# Patient Record
Sex: Female | Born: 1985 | Race: Black or African American | Hispanic: No | Marital: Single | Smoking: Current some day smoker
Health system: Southern US, Community
[De-identification: ages and names within clinical notes are randomized; demographics above are authoritative.]

## PROBLEM LIST (undated history)

## (undated) ENCOUNTER — Emergency Department: Payer: Managed Care, Other (non HMO)

## (undated) DIAGNOSIS — I10 Essential (primary) hypertension: Secondary | ICD-10-CM

## (undated) DIAGNOSIS — E049 Nontoxic goiter, unspecified: Secondary | ICD-10-CM

## (undated) DIAGNOSIS — F419 Anxiety disorder, unspecified: Secondary | ICD-10-CM

## (undated) HISTORY — DX: Nontoxic goiter, unspecified: E04.9

## (undated) HISTORY — PX: NO PAST SURGERIES: SHX2092

## (undated) HISTORY — DX: Anxiety disorder, unspecified: F41.9

---

## 2015-01-06 ENCOUNTER — Ambulatory Visit
Admission: EM | Admit: 2015-01-06 | Discharge: 2015-01-06 | Disposition: A | Discharge status: Home / Self Care | Payer: 59 | Attending: Family Medicine | Admitting: Family Medicine

## 2015-01-06 ENCOUNTER — Other Ambulatory Visit: Payer: Self-pay

## 2015-01-06 DIAGNOSIS — R9431 Abnormal electrocardiogram [ECG] [EKG]: Secondary | ICD-10-CM

## 2015-01-06 DIAGNOSIS — R002 Palpitations: Secondary | ICD-10-CM | POA: Insufficient documentation

## 2015-01-06 DIAGNOSIS — R079 Chest pain, unspecified: Secondary | ICD-10-CM | POA: Diagnosis not present

## 2015-01-06 DIAGNOSIS — R42 Dizziness and giddiness: Secondary | ICD-10-CM | POA: Diagnosis present

## 2015-01-06 DIAGNOSIS — F17210 Nicotine dependence, cigarettes, uncomplicated: Secondary | ICD-10-CM | POA: Insufficient documentation

## 2015-01-06 DIAGNOSIS — R0789 Other chest pain: Secondary | ICD-10-CM | POA: Diagnosis not present

## 2015-01-06 DIAGNOSIS — Z720 Tobacco use: Secondary | ICD-10-CM

## 2015-01-06 DIAGNOSIS — R55 Syncope and collapse: Secondary | ICD-10-CM | POA: Insufficient documentation

## 2015-01-06 NOTE — ED Provider Notes (Signed)
CSN: [id number]     Arrival date & time 01/06/15  1420 History   First MD Initiated Contact with Patient 01/06/15 1509     Chief Complaint  Patient presents with  . Dizziness    Yesterday had an episode where she felt weak and reported to pass out with staff at where she worked called a code blue.  She was fine and did not follow up with medical care.  Last night she felt numb all over and just feels uncomfortable and feels like a flutter in her chest and discomfort.  She said her PMD has been following her blood pressure and it was rthinking about putting her on blood pressure medications.  She said that when they took her blood pressure it was she thinks 130/60   (Consider location/radiation/quality/duration/timing/severity/associated sxs/prior Treatment) Patient is a 29 y.o. female presenting with dizziness. The history is provided by the patient. No language interpreter was used.  Dizziness Quality:  Head spinning Severity:  Moderate Duration:  2 days Timing:  Intermittent Progression:  Waxing and waning Chronicity:  Recurrent Context: not with loss of consciousness   Relieved by:  Nothing Worsened by:  Movement Ineffective treatments:  None tried Associated symptoms: chest pain and shortness of breath   Chest pain:    Quality: pressure and shooting     Quality: not radiating     Severity:  Mild   Onset quality:  Sudden   Duration:  6 hours   Timing:  Intermittent   Chronicity:  New Risk factors: no anemia, no heart disease, no hx of stroke, no hx of vertigo, no Meniere's disease and no multiple medications   History reviewed. No pertinent past medical history. History reviewed. No pertinent past surgical history. History reviewed. No pertinent family history. History  Substance Use Topics  . Smoking status: Current Every Day Smoker    Types: Cigars  . Smokeless tobacco: Not on file  . Alcohol Use: Yes   OB History    No data available     Review of Systems   Respiratory: Positive for shortness of breath.   Cardiovascular: Positive for chest pain.  Neurological: Positive for dizziness.  All other systems reviewed and are negative.   Allergies  Review of patient's allergies indicates no known allergies.  Home Medications   Prior to Admission medications   Not on File   BP 144/74 mmHg  Pulse 79  Temp(Src) 98.6 F (37 C) (Oral)  Resp 16  SpO2 100% Physical Exam  Constitutional: She is oriented to person, place, and time. She appears well-developed and well-nourished.  HENT:  Head: Normocephalic and atraumatic.  Eyes: Pupils are equal, round, and reactive to light.  Neck: Neck supple.  Cardiovascular: Regular rhythm and normal heart sounds.   Pulmonary/Chest: Effort normal.  Abdominal: Bowel sounds are normal.  Musculoskeletal: Normal range of motion.  Neurological: She is alert and oriented to person, place, and time.  Skin: Skin is warm.  Vitals reviewed.   ED Course  Procedures (including critical care time) Labs Review Labs Reviewed - No data to display  Imaging Review No results found. Should be noted the patient has been having history of elevation of her blood pressure. She still smokes and was placed on a dash diet. Despite that she's been eating noodles and loaded with salt but just diluting the sauce with more water. I've informed her that that doesn't help lower the sodium intake that she's having. Yesterday she had a near-syncopal episode have to  have her workplace [employer] team responded to her. She went home to get medical care and today woke up with palpitations and that has some chest pain. She still feels lightheaded and dizzy and felt that she needed to be evaluated and treated. She was informed with her symptoms emergency room with a better place for her. She probably will need serial cardiac enzymes, Holter monitor lab work and possible CT of the head. Offered EMS patient wants to go by private car. Warned that  she needs to stop smoking regardless as well.  EKG was also abnormal showing inverted T waves to 3 and aVF and some nonspecific ST elevation in V2 with T-wave inversion in 2 through V6 indicating a possible inferior ischemia or anterolateral ischemia. She denies ever having an EKG done before so we have no way of knowing how acute changes are but once again emphasize the need to go to an ED for more thorough evaluation.  No results found for this or any previous visit.  MDM   1. Other chest pain   2. Palpitations   3. Abnormal finding on EKG   4. Near syncope   5. Dizziness   6. Tobacco abuse        Frederich Cha, MD 01/06/15 [phone]

## 2015-01-06 NOTE — Discharge Instructions (Signed)
Chest Pain (Nonspecific) °It is often hard to give a specific diagnosis for the cause of chest pain. There is always a chance that your pain could be related to something serious, such as a heart attack or a blood clot in the lungs. You need to follow up with your health care provider for further evaluation. °CAUSES  °· Heartburn. °· Pneumonia or bronchitis. °· Anxiety or stress. °· Inflammation around your heart (pericarditis) or lung (pleuritis or pleurisy). °· A blood clot in the lung. °· A collapsed lung (pneumothorax). It can develop suddenly on its own (spontaneous pneumothorax) or from trauma to the chest. °· Shingles infection (herpes zoster virus). °The chest wall is composed of bones, muscles, and cartilage. Any of these can be the source of the pain. °· The bones can be bruised by injury. °· The muscles or cartilage can be strained by coughing or overwork. °· The cartilage can be affected by inflammation and become sore (costochondritis). °DIAGNOSIS  °Lab tests or other studies may be needed to find the cause of your pain. Your health care provider may have you take a test called an ambulatory electrocardiogram (ECG). An ECG records your heartbeat patterns over a 24-hour period. You may also have other tests, such as: °· Transthoracic echocardiogram (TTE). During echocardiography, sound waves are used to evaluate how blood flows through your heart. °· Transesophageal echocardiogram (TEE). °· Cardiac monitoring. This allows your health care provider to monitor your heart rate and rhythm in real time. °· Holter monitor. This is a portable device that records your heartbeat and can help diagnose heart arrhythmias. It allows your health care provider to track your heart activity for several days, if needed. °· Stress tests by exercise or by giving medicine that makes the heart beat faster. °TREATMENT  °· Treatment depends on what may be causing your chest pain. Treatment may include: °· Acid blockers for  heartburn. °· Anti-inflammatory medicine. °· Pain medicine for inflammatory conditions. °· Antibiotics if an infection is present. °· You may be advised to change lifestyle habits. This includes stopping smoking and avoiding alcohol, caffeine, and chocolate. °· You may be advised to keep your head raised (elevated) when sleeping. This reduces the chance of acid going backward from your stomach into your esophagus. °Most of the time, nonspecific chest pain will improve within 2-3 days with rest and mild pain medicine.  °HOME CARE INSTRUCTIONS  °· If antibiotics were prescribed, take them as directed. Finish them even if you start to feel better. °· For the next few days, avoid physical activities that bring on chest pain. Continue physical activities as directed. °· Do not use any tobacco products, including cigarettes, chewing tobacco, or electronic cigarettes. °· Avoid drinking alcohol. °· Only take medicine as directed by your health care provider. °· Follow your health care provider's suggestions for further testing if your chest pain does not go away. °· Keep any follow-up appointments you made. If you do not go to an appointment, you could develop lasting (chronic) problems with pain. If there is any problem keeping an appointment, call to reschedule. °SEEK MEDICAL CARE IF:  °· Your chest pain does not go away, even after treatment. °· You have a rash with blisters on your chest. °· You have a fever. °SEEK IMMEDIATE MEDICAL CARE IF:  °· You have increased chest pain or pain that spreads to your arm, neck, jaw, back, or abdomen. °· You have shortness of breath. °· You have an increasing cough, or you cough   up blood.  You have severe back or abdominal pain.  You feel nauseous or vomit.  You have severe weakness.  You faint.  You have chills. This is an emergency. Do not wait to see if the pain will go away. Get medical help at once. Call your local emergency services (911 in [place] Do not drive  yourself to the hospital. MAKE SURE YOU:   Understand these instructions.  Will watch your condition.  Will get help right away if you are not doing well or get worse. Document Released: 05/01/2005 Document Revised: 07/27/2013 Document Reviewed: 02/25/2008 [facility] Patient Information 2015 [place], [place]. This information is not intended to replace advice given to you by your health care provider. Make sure you discuss any questions you have with your health care provider.  Near-Syncope Near-syncope (commonly known as near fainting) is sudden weakness, dizziness, or feeling like you might pass out. This can happen when getting up or while standing for a long time. It is caused by a sudden decrease in blood flow to the brain, which can occur for various reasons. Most of the reasons are not serious.  HOME CARE Watch your condition for any changes.  Have someone stay with you until you feel stable.  If you feel like you are going to pass out:  Lie down right away.  Prop your feet up if you can.  Breathe deeply and steadily.  Move only when the feeling has gone away. Most of the time, this feeling lasts only a few minutes. You may feel tired for several hours.  Drink enough fluids to keep your pee (urine) clear or pale yellow.  If you are taking blood pressure or heart medicine, stand up slowly.  Follow up with your doctor as told. GET HELP RIGHT AWAY IF:   You have a severe headache.  You have unusual pain in the chest, belly (abdomen), or back.  You have bleeding from the mouth or butt (rectum), or you have black or tarry poop (stool).  You feel your heart beat differently than normal, or you have a very fast pulse.  You pass out, or you twitch and shake when you pass out.  You pass out when sitting or lying down.  You feel confused.  You have trouble walking.  You are weak.  You have vision problems. MAKE SURE YOU:   Understand these instructions.  Will watch  your condition.  Will get help right away if you are not doing well or get worse. Document Released: 01/08/2008 Document Revised: 07/27/2013 Document Reviewed: 12/25/2012 [facility] Patient Information 2015 [place], [place]. This information is not intended to replace advice given to you by your health care provider. Make sure you discuss any questions you have with your health care provider.  Palpitations A palpitation is the feeling that your heartbeat is irregular or is faster than normal. It may feel like your heart is fluttering or skipping a beat. Palpitations are usually not a serious problem. However, in some cases, you may need further medical evaluation. CAUSES  Palpitations can be caused by:  Smoking.  Caffeine or other stimulants, such as diet pills or energy drinks.  Alcohol.  Stress and anxiety.  Strenuous physical activity.  Fatigue.  Certain medicines.  Heart disease, especially if you have a history of irregular heart rhythms (arrhythmias), such as atrial fibrillation, atrial flutter, or supraventricular tachycardia.  An improperly working pacemaker or defibrillator. DIAGNOSIS  To find the cause of your palpitations, your health care provider will  take your medical history and perform a physical exam. Your health care provider may also have you take a test called an ambulatory electrocardiogram (ECG). An ECG records your heartbeat patterns over a 24-hour period. You may also have other tests, such as:  Transthoracic echocardiogram (TTE). During echocardiography, sound waves are used to evaluate how blood flows through your heart.  Transesophageal echocardiogram (TEE).  Cardiac monitoring. This allows your health care provider to monitor your heart rate and rhythm in real time.  Holter monitor. This is a portable device that records your heartbeat and can help diagnose heart arrhythmias. It allows your health care provider to track your heart activity for several  days, if needed.  Stress tests by exercise or by giving medicine that makes the heart beat faster. TREATMENT  Treatment of palpitations depends on the cause of your symptoms and can vary greatly. Most cases of palpitations do not require any treatment other than time, relaxation, and monitoring your symptoms. Other causes, such as atrial fibrillation, atrial flutter, or supraventricular tachycardia, usually require further treatment. HOME CARE INSTRUCTIONS   Avoid:  Caffeinated coffee, tea, soft drinks, diet pills, and energy drinks.  Chocolate.  Alcohol.  Stop smoking if you smoke.  Reduce your stress and anxiety. Things that can help you relax include:  A method of controlling things in your body, such as your heartbeats, with your mind (biofeedback).  Yoga.  Meditation.  Physical activity such as swimming, jogging, or walking.  Get plenty of rest and sleep. SEEK MEDICAL CARE IF:   You continue to have a fast or irregular heartbeat beyond 24 hours.  Your palpitations occur more often. SEEK IMMEDIATE MEDICAL CARE IF:  You have chest pain or shortness of breath.  You have a severe headache.  You feel dizzy or you faint. MAKE SURE YOU:  Understand these instructions.  Will watch your condition.  Will get help right away if you are not doing well or get worse. Document Released: 07/19/2000 Document Revised: 07/27/2013 Document Reviewed: 09/20/2011 [facility] Patient Information 2015 [place], [place]. This information is not intended to replace advice given to you by your health care provider. Make sure you discuss any questions you have with your health care provider.

## 2015-01-06 NOTE — ED Notes (Signed)
Yesterday had an episode where she felt weak and reported to pass out with staff at where she worked called a code blue.  She was fine and did not follow up with medical care.  Last night she felt numb all over and just feels uncomfortable and feels like a flutter in her chest and discomfort.  She said her PMD has been following her blood pressure and it was rthinking about putting her on blood pressure medications.  She said that when they took her blood pressure it was she thinks 130/60

## 2017-03-18 ENCOUNTER — Emergency Department
Admission: EM | Admit: 2017-03-18 | Discharge: 2017-03-18 | Disposition: A | Discharge status: Home / Self Care | Payer: 59 | Attending: Emergency Medicine | Admitting: Emergency Medicine

## 2017-03-18 ENCOUNTER — Emergency Department: Payer: 59

## 2017-03-18 ENCOUNTER — Encounter: Payer: Self-pay | Admitting: Emergency Medicine

## 2017-03-18 DIAGNOSIS — R1030 Lower abdominal pain, unspecified: Secondary | ICD-10-CM | POA: Diagnosis present

## 2017-03-18 DIAGNOSIS — Z3A01 Less than 8 weeks gestation of pregnancy: Secondary | ICD-10-CM | POA: Insufficient documentation

## 2017-03-18 DIAGNOSIS — O209 Hemorrhage in early pregnancy, unspecified: Secondary | ICD-10-CM | POA: Insufficient documentation

## 2017-03-18 DIAGNOSIS — R102 Pelvic and perineal pain: Secondary | ICD-10-CM

## 2017-03-18 DIAGNOSIS — Z87891 Personal history of nicotine dependence: Secondary | ICD-10-CM | POA: Diagnosis not present

## 2017-03-18 DIAGNOSIS — O200 Threatened abortion: Secondary | ICD-10-CM

## 2017-03-18 LAB — URINALYSIS, COMPLETE (UACMP) WITH MICROSCOPIC
BILIRUBIN URINE: NEGATIVE
Glucose, UA: NEGATIVE mg/dL
Hgb urine dipstick: NEGATIVE
Ketones, ur: NEGATIVE mg/dL
LEUKOCYTES UA: NEGATIVE
NITRITE: NEGATIVE
PH: 6 (ref 5.0–8.0)
Protein, ur: NEGATIVE mg/dL
SPECIFIC GRAVITY, URINE: 1.013 (ref 1.005–1.030)

## 2017-03-18 LAB — CBC
HEMATOCRIT: 36 % (ref 35.0–47.0)
HEMOGLOBIN: 12.1 g/dL (ref 12.0–16.0)
MCH: 28.2 pg (ref 26.0–34.0)
MCHC: 33.7 g/dL (ref 32.0–36.0)
MCV: 83.6 fL (ref 80.0–100.0)
Platelets: 298 10*3/uL (ref 150–440)
RBC: 4.3 MIL/uL (ref 3.80–5.20)
RDW: 13.6 % (ref 11.5–14.5)
WBC: 8.1 10*3/uL (ref 3.6–11.0)

## 2017-03-18 LAB — ABO/RH: ABO/RH(D): B POS

## 2017-03-18 LAB — COMPREHENSIVE METABOLIC PANEL
ALBUMIN: 4.4 g/dL (ref 3.5–5.0)
ALK PHOS: 57 U/L (ref 38–126)
ALT: 20 U/L (ref 14–54)
ANION GAP: 9 (ref 5–15)
AST: 22 U/L (ref 15–41)
BILIRUBIN TOTAL: 0.4 mg/dL (ref 0.3–1.2)
BUN: 11 mg/dL (ref 6–20)
CALCIUM: 9.5 mg/dL (ref 8.9–10.3)
CO2: 22 mmol/L (ref 22–32)
CREATININE: 0.84 mg/dL (ref 0.44–1.00)
Chloride: 104 mmol/L (ref 101–111)
GFR calc Af Amer: >60 mL/min (ref >60)
GFR calc non Af Amer: >60 mL/min (ref >60)
GLUCOSE: 119 mg/dL — AB (ref 65–99)
Potassium: 3.9 mmol/L (ref 3.5–5.1)
SODIUM: 135 mmol/L (ref 135–145)
TOTAL PROTEIN: 8.3 g/dL — AB (ref 6.5–8.1)

## 2017-03-18 LAB — POCT PREGNANCY, URINE: Preg Test, Ur: POSITIVE — AB

## 2017-03-18 LAB — HCG, QUANTITATIVE, PREGNANCY: hCG, Beta Chain, Quant, S: 9048 m[IU]/mL — ABNORMAL HIGH (ref <5)

## 2017-03-18 NOTE — Discharge Instructions (Signed)
Results for orders placed or performed during the hospital encounter of 03/18/17  Urinalysis, Complete w Microscopic  Result Value Ref Range   Color, Urine YELLOW (A) YELLOW   APPearance CLEAR (A) CLEAR   Specific Gravity, Urine 1.013 1.005 - 1.030   pH 6.0 5.0 - 8.0   Glucose, UA NEGATIVE NEGATIVE mg/dL   Hgb urine dipstick NEGATIVE NEGATIVE   Bilirubin Urine NEGATIVE NEGATIVE   Ketones, ur NEGATIVE NEGATIVE mg/dL   Protein, ur NEGATIVE NEGATIVE mg/dL   Nitrite NEGATIVE NEGATIVE   Leukocytes, UA NEGATIVE NEGATIVE   RBC / HPF 0-5 0 - 5 RBC/hpf   WBC, UA 0-5 0 - 5 WBC/hpf   Bacteria, UA RARE (A) NONE SEEN   Squamous Epithelial / LPF 0-5 (A) NONE SEEN   Mucous PRESENT   hCG, quantitative, pregnancy  Result Value Ref Range   hCG, Beta Chain, Quant, S 9,048 (H) <5 mIU/mL  CBC  Result Value Ref Range   WBC 8.1 3.6 - 11.0 K/uL   RBC 4.30 3.80 - 5.20 MIL/uL   Hemoglobin 12.1 12.0 - 16.0 g/dL   HCT 36.0 35.0 - 47.0 %   MCV 83.6 80.0 - 100.0 fL   MCH 28.2 26.0 - 34.0 pg   MCHC 33.7 32.0 - 36.0 g/dL   RDW 13.6 11.5 - 14.5 %   Platelets 298 150 - 440 K/uL  Comprehensive metabolic panel  Result Value Ref Range   Sodium 135 135 - 145 mmol/L   Potassium 3.9 3.5 - 5.1 mmol/L   Chloride 104 101 - 111 mmol/L   CO2 22 22 - 32 mmol/L   Glucose, Bld 119 (H) 65 - 99 mg/dL   BUN 11 6 - 20 mg/dL   Creatinine, Ser 0.84 0.44 - 1.00 mg/dL   Calcium 9.5 8.9 - 10.3 mg/dL   Total Protein 8.3 (H) 6.5 - 8.1 g/dL   Albumin 4.4 3.5 - 5.0 g/dL   AST 22 15 - 41 U/L   ALT 20 14 - 54 U/L   Alkaline Phosphatase 57 38 - 126 U/L   Total Bilirubin 0.4 0.3 - 1.2 mg/dL   GFR calc non Af Amer >60 >60 mL/min   GFR calc Af Amer >60 >60 mL/min   Anion gap 9 5 - 15  Pregnancy, urine POC  Result Value Ref Range   Preg Test, Ur POSITIVE (A) NEGATIVE  ABO/Rh  Result Value Ref Range   ABO/RH(D) B POS    US Ob Comp Less 14 Wks  Result Date: 03/18/2017 CLINICAL DATA:  Pregnant patient in first-trimester  pregnancy with lower abdominal pain and spotting. EXAM: OBSTETRIC <14 WK [place] AND TRANSVAGINAL OB US TECHNIQUE: Both transabdominal and transvaginal ultrasound examinations were performed for complete evaluation of the gestation as well as the maternal uterus, adnexal regions, and pelvic cul-de-sac. Transvaginal technique was performed to assess early pregnancy. COMPARISON:  None. FINDINGS: Intrauterine gestational sac: Single Yolk sac:  Visualized. Embryo:  Not Visualized. Cardiac Activity: Not Visualized. MSD: 9  mm   5 w   5  d Subchorionic hemorrhage:  None visualized. Maternal uterus/adnexae: The right ovary is visualized and normal. There is a rounded echogenic structure in the left adnexa measuring 6.5 x 6.8 x 7.1 cm without definite vascularity. Left ovary is not discretely visualized. Trace pelvic free fluid. IMPRESSION: 1. Probable early intrauterine gestational sac with a yolk sac, but no fetal pole or cardiac activity yet visualized. Recommend follow-up quantitative B-HCG levels and follow-up US in 10-14 days  to assess viability. This recommendation follows SRU consensus guidelines: Diagnostic Criteria for Nonviable Pregnancy Early in the First Trimester. [employer] Med 2013; 737:1062-69. 2. Round heterogeneous 7 cm echogenic structure in the left adnexa, with nonvisualization of left ovary. Differential considerations include left ovarian mass versus non peristalsing bowel. This can be re-assessed on follow-up ultrasound. Electronically Signed   By: Jeb Levering M.D.   On: 03/18/2017 21:40   US Ob Transvaginal  Result Date: 03/18/2017 CLINICAL DATA:  Pregnant patient in first-trimester pregnancy with lower abdominal pain and spotting. EXAM: OBSTETRIC <14 WK [place] AND TRANSVAGINAL OB US TECHNIQUE: Both transabdominal and transvaginal ultrasound examinations were performed for complete evaluation of the gestation as well as the maternal uterus, adnexal regions, and pelvic cul-de-sac. Transvaginal  technique was performed to assess early pregnancy. COMPARISON:  None. FINDINGS: Intrauterine gestational sac: Single Yolk sac:  Visualized. Embryo:  Not Visualized. Cardiac Activity: Not Visualized. MSD: 9  mm   5 w   5  d Subchorionic hemorrhage:  None visualized. Maternal uterus/adnexae: The right ovary is visualized and normal. There is a rounded echogenic structure in the left adnexa measuring 6.5 x 6.8 x 7.1 cm without definite vascularity. Left ovary is not discretely visualized. Trace pelvic free fluid. IMPRESSION: 1. Probable early intrauterine gestational sac with a yolk sac, but no fetal pole or cardiac activity yet visualized. Recommend follow-up quantitative B-HCG levels and follow-up US in 10-14 days to assess viability. This recommendation follows SRU consensus guidelines: Diagnostic Criteria for Nonviable Pregnancy Early in the First Trimester. [employer] Med 2013; 485:4627-03. 2. Round heterogeneous 7 cm echogenic structure in the left adnexa, with nonvisualization of left ovary. Differential considerations include left ovarian mass versus non peristalsing bowel. This can be re-assessed on follow-up ultrasound. Electronically Signed   By: Jeb Levering M.D.   On: 03/18/2017 21:40

## 2017-03-18 NOTE — ED Notes (Signed)
Pt verbalizes understanding of d/c teaching, pt ambulatory , VS stable in NAD during time of d/c

## 2017-03-18 NOTE — ED Triage Notes (Signed)
Patient ambulatory to triage with steady gait, without difficulty or distress noted; pt reports lower abd pain, intermittent since last week; denies any other c/o; St confirmed pregnancy via [facility], approx 5-6 wks; G2P1; st spotting earlier but no bleeding at present

## 2017-03-18 NOTE — ED Provider Notes (Signed)
[facility] Emergency Department Provider Note  ____________________________________________  Time seen: Approximately 9:40 PM  I have reviewed the triage vital signs and the nursing notes.   HISTORY  Chief Complaint Abdominal Pain    HPI Joanne Shannon is a 31 y.o. female G2 P1, [redacted] weeks pregnant, appointment planned to establish care at [facility] on September 10, complains of vaginal spotting earlier today. She noticed spotting 2 times while using the bathroom, has since resolved. Also complains of cramping lower abdominal pain for the past week, nonradiating, no aggravating or alleviating factors. No vaginal discharge or fluid leakage. No fevers chills chest pain shortness of breath. Pain is mild to moderate in intensity. Intermittent.     History reviewed. No pertinent past medical history.   There are no active problems to display for this patient.    History reviewed. No pertinent surgical history.   Prior to Admission medications   Not on File  Folic acid supplement   Allergies Patient has no known allergies.   No family history on file.  Social History Social History  Substance Use Topics  . Smoking status: Former Smoker    Types: Cigars  . Smokeless tobacco: Never Used  . Alcohol use No    Review of Systems  Constitutional:   No fever or chills.  ENT:   No sore throat. No rhinorrhea. Cardiovascular:   No chest pain or syncope. Respiratory:   No dyspnea or cough. Gastrointestinal:   Positive for cramping abdominal pain as above without vomiting and diarrhea.  Musculoskeletal:   Negative for focal pain or swelling All other systems reviewed and are negative except as documented above in ROS and HPI.  ____________________________________________   PHYSICAL EXAM:  VITAL SIGNS: ED Triage Vitals  Enc Vitals Group     BP 03/18/17 1929 (!) 145/86     Pulse Rate 03/18/17 1929 81     Resp 03/18/17 1929 20   Temp 03/18/17 1929 98 F (36.7 C)     Temp Source 03/18/17 1929 Oral     SpO2 03/18/17 1929 100 %     Weight 03/18/17 1927 243 lb (110.2 kg)     Height 03/18/17 1927 5\' 8"  (1.727 m)     Head Circumference --      Peak Flow --      Pain Score 03/18/17 1927 2     Pain Loc --      Pain Edu? --      Excl. in [place]? --     Vital signs reviewed, nursing assessments reviewed.   Constitutional:   Alert and oriented. Well appearing and in no distress. Eyes:   No scleral icterus.  EOMI. No nystagmus. No conjunctival pallor. PERRL. ENT   Head:   Normocephalic and atraumatic.   Nose:   No congestion/rhinnorhea.    Mouth/Throat:   MMM, no pharyngeal erythema. No peritonsillar mass.    Neck:   No meningismus. Full ROM Hematological/Lymphatic/Immunilogical:   No cervical lymphadenopathy. Cardiovascular:   RRR. Symmetric bilateral radial and DP pulses.  No murmurs.  Respiratory:   Normal respiratory effort without tachypnea/retractions. Breath sounds are clear and equal bilaterally. No wheezes/rales/rhonchi. Gastrointestinal:   Soft and nontender. Non distended. There is no CVA tenderness.  No rebound, rigidity, or guarding. Genitourinary:   deferred Musculoskeletal:   Normal range of motion in all extremities. No joint effusions.  No lower extremity tenderness.  No edema. Neurologic:   Normal speech and language.  Motor grossly intact.  No gross focal neurologic deficits are appreciated.  Skin:    Skin is warm, dry and intact. No rash noted.  No petechiae, purpura, or bullae.  ____________________________________________    LABS (pertinent positives/negatives) (all labs ordered are listed, but only abnormal results are displayed) Labs Reviewed  URINALYSIS, COMPLETE (UACMP) WITH MICROSCOPIC - Abnormal; Notable for the following:       Result Value   Color, Urine YELLOW (*)    APPearance CLEAR (*)    Bacteria, UA RARE (*)    Squamous Epithelial / LPF 0-5 (*)    All other  components within normal limits  HCG, QUANTITATIVE, PREGNANCY - Abnormal; Notable for the following:    hCG, Beta Chain, Quant, S 9,048 (*)    All other components within normal limits  COMPREHENSIVE METABOLIC PANEL - Abnormal; Notable for the following:    Glucose, Bld 119 (*)    Total Protein 8.3 (*)    All other components within normal limits  POCT PREGNANCY, URINE - Abnormal; Notable for the following:    Preg Test, Ur POSITIVE (*)    All other components within normal limits  CBC  ABO/RH   ____________________________________________   EKG    ____________________________________________    RADIOLOGY  US Ob Comp Less 14 Wks  Result Date: 03/18/2017 CLINICAL DATA:  Pregnant patient in first-trimester pregnancy with lower abdominal pain and spotting. EXAM: OBSTETRIC <14 WK [place] AND TRANSVAGINAL OB US TECHNIQUE: Both transabdominal and transvaginal ultrasound examinations were performed for complete evaluation of the gestation as well as the maternal uterus, adnexal regions, and pelvic cul-de-sac. Transvaginal technique was performed to assess early pregnancy. COMPARISON:  None. FINDINGS: Intrauterine gestational sac: Single Yolk sac:  Visualized. Embryo:  Not Visualized. Cardiac Activity: Not Visualized. MSD: 9  mm   5 w   5  d Subchorionic hemorrhage:  None visualized. Maternal uterus/adnexae: The right ovary is visualized and normal. There is a rounded echogenic structure in the left adnexa measuring 6.5 x 6.8 x 7.1 cm without definite vascularity. Left ovary is not discretely visualized. Trace pelvic free fluid. IMPRESSION: 1. Probable early intrauterine gestational sac with a yolk sac, but no fetal pole or cardiac activity yet visualized. Recommend follow-up quantitative B-HCG levels and follow-up US in 10-14 days to assess viability. This recommendation follows SRU consensus guidelines: Diagnostic Criteria for Nonviable Pregnancy Early in the First Trimester. [employer] Med 2013;  034:7425-95. 2. Round heterogeneous 7 cm echogenic structure in the left adnexa, with nonvisualization of left ovary. Differential considerations include left ovarian mass versus non peristalsing bowel. This can be re-assessed on follow-up ultrasound. Electronically Signed   By: Jeb Levering M.D.   On: 03/18/2017 21:40   US Ob Transvaginal  Result Date: 03/18/2017 CLINICAL DATA:  Pregnant patient in first-trimester pregnancy with lower abdominal pain and spotting. EXAM: OBSTETRIC <14 WK [place] AND TRANSVAGINAL OB US TECHNIQUE: Both transabdominal and transvaginal ultrasound examinations were performed for complete evaluation of the gestation as well as the maternal uterus, adnexal regions, and pelvic cul-de-sac. Transvaginal technique was performed to assess early pregnancy. COMPARISON:  None. FINDINGS: Intrauterine gestational sac: Single Yolk sac:  Visualized. Embryo:  Not Visualized. Cardiac Activity: Not Visualized. MSD: 9  mm   5 w   5  d Subchorionic hemorrhage:  None visualized. Maternal uterus/adnexae: The right ovary is visualized and normal. There is a rounded echogenic structure in the left adnexa measuring 6.5 x 6.8 x 7.1 cm without definite vascularity. Left ovary is not discretely  visualized. Trace pelvic free fluid. IMPRESSION: 1. Probable early intrauterine gestational sac with a yolk sac, but no fetal pole or cardiac activity yet visualized. Recommend follow-up quantitative B-HCG levels and follow-up US in 10-14 days to assess viability. This recommendation follows SRU consensus guidelines: Diagnostic Criteria for Nonviable Pregnancy Early in the First Trimester. [employer] Med 2013; 419:6222-97. 2. Round heterogeneous 7 cm echogenic structure in the left adnexa, with nonvisualization of left ovary. Differential considerations include left ovarian mass versus non peristalsing bowel. This can be re-assessed on follow-up ultrasound. Electronically Signed   By: Jeb Levering M.D.   On: 03/18/2017  21:40    ____________________________________________   PROCEDURES Procedures  ____________________________________________   INITIAL IMPRESSION / ASSESSMENT AND PLAN / ED COURSE  Pertinent labs & imaging results that were available during my care of the patient were reviewed by me and considered in my medical decision making (see chart for details).  Patient well appearing no acute distress, unremarkable vital signs, presents with vaginal bleeding characterized by spotting in the setting of 5-6 week pregnancy. Rh+, exam benign. Ultrasound ordered to evaluate for ectopic. If IUP is demonstrated, patient is suitable for outpatient follow-up. Recommended follow-up in 2 days for serial hCG testing.      ----------------------------------------- 10:22 PM on 03/18/2017 -----------------------------------------  Ultrasound reveals an IUP with yolk sac but no fetal pole. 7 cm left adnexal mass, ovarian versus loop of bowel according to the radiology interpretation. Case discussed with Dr. Enzo Bi, noting that hCG is 9000 but no fetal pole or cardiac activity yet. They will plan to see the patient in clinic in 2 days for repeat hCG and ultrasound. Patient counseled on results and plan and is agreeable. Patient will call clinic in the morning to schedule appointment. She is calm and comfortable. Presentation is not consistent with TOA or torsion. Low likelihood of ectopic, suitable for outpatient follow-up. Low suspicion of appendicitis or bowel structure. ____________________________________________   FINAL CLINICAL IMPRESSION(S) / ED DIAGNOSES  Final diagnoses:  Pelvic pain in female  Vaginal bleeding in pregnancy, first trimester  Threatened miscarriage      New Prescriptions   No medications on file     Portions of this note were generated with dragon dictation software. Dictation errors may occur despite best attempts at proofreading.    Carrie Mew, MD 03/18/17  2225

## 2017-03-18 NOTE — ED Notes (Signed)
Patient presents to ED for complaints of midabominal pain and states she is [redacted] weeks pregnant. Spotting earlier today but has stopped at this time.

## 2017-03-21 ENCOUNTER — Ambulatory Visit (INDEPENDENT_AMBULATORY_CARE_PROVIDER_SITE_OTHER): Payer: 59 | Admitting: Obstetrics and Gynecology

## 2017-03-21 ENCOUNTER — Encounter: Payer: Self-pay | Admitting: Obstetrics and Gynecology

## 2017-03-21 VITALS — BP 123/78 | HR 90 | Ht 68.0 in | Wt 243.8 lb

## 2017-03-21 DIAGNOSIS — O200 Threatened abortion: Secondary | ICD-10-CM | POA: Diagnosis not present

## 2017-03-21 DIAGNOSIS — N9489 Other specified conditions associated with female genital organs and menstrual cycle: Secondary | ICD-10-CM

## 2017-03-21 DIAGNOSIS — N949 Unspecified condition associated with female genital organs and menstrual cycle: Secondary | ICD-10-CM

## 2017-03-21 NOTE — Patient Instructions (Addendum)
1. Quantitative hCG titer is obtained today 2. Ultrasound is to be scheduled for next Wednesday 3. Follow-up after ultrasound next Wednesday for further management planning 4. Return if heavy bleeding or pelvic pain developed

## 2017-03-21 NOTE — Progress Notes (Signed)
GYN ENCOUNTER NOTE  Subjective:       Joanne Shannon is a 31 y.o. G86P1001 female is here for gynecologic evaluation of the following issues:  1. Threatened abortion 2. Left adnexal mass.    Emergency room referral for threatened abortion. Quantitative hCG in 03/18/2017 9048. Ultrasound 03/18/2017:  IMPRESSION: 1. Probable early intrauterine gestational sac with a yolk sac, but no fetal pole or cardiac activity yet visualized. Recommend follow-up quantitative B-HCG levels and follow-up US in 10-14 days to assess viability. This recommendation follows SRU consensus guidelines: Diagnostic Criteria for Nonviable Pregnancy Early in the First Trimester. [employer] Med 2013; 676:7209-47. 2. Round heterogeneous 7 cm echogenic structure in the left adnexa, with nonvisualization of left ovary. Differential considerations include left ovarian mass versus non peristalsing bowel. This can be re-assessed on follow-up ultrasound.   Electronically Signed   By: Jeb Levering M.D.   On: 03/18/2017 21:40  Patient reports no further significant vaginal bleeding. She has minimal pelvic discomfort rated at a 2 out of 10. Patient reports having breast tenderness and mild nausea without vomiting. Bowel and bladder function are normal.   Gynecologic History Patient's last menstrual period was 02/06/2017 (exact date).  Obstetric History OB History  Gravida Para Term Preterm AB Living  2 1 1     1   SAB TAB Ectopic Multiple Live Births          1    # Outcome Date GA Lbr Len/2nd Weight Sex Delivery Anes PTL Lv  2 Current           1 Term 2008   7 lb 3.2 oz (3.266 kg) M Vag-Spont   LIV      Past Medical History:  Diagnosis Date  . Anxiety   . Enlarged thyroid     Past Surgical History:  Procedure Laterality Date  . NO PAST SURGERIES      No current outpatient prescriptions on file prior to visit.   No current facility-administered medications on file prior to visit.     No  Known Allergies  Social History   Social History  . Marital status: Single    Spouse name: N/A  . Number of children: N/A  . Years of education: N/A   Occupational History  . Not on file.   Social History Main Topics  . Smoking status: Former Smoker    Types: Cigars  . Smokeless tobacco: Never Used  . Alcohol use No  . Drug use: No  . Sexual activity: Yes    Birth control/ protection: None   Other Topics Concern  . Not on file   Social History Narrative  . No narrative on file    Family History  Problem Relation Age of Onset  . Diabetes Sister   . Breast cancer Neg Hx   . Ovarian cancer Neg Hx   . Colon cancer Neg Hx     The following portions of the patient's history were reviewed and updated as appropriate: allergies, current medications, past family history, past medical history, past social history, past surgical history and problem list.  Review of Systems See history of present illness  Objective:   BP 123/78   Pulse 90   Ht 5\' 8"  (1.727 m)   Wt 243 lb 12.8 oz (110.6 kg)   LMP 02/06/2017 (Exact Date)   BMI 37.07 kg/m  CONSTITUTIONAL: Well-developed, well-nourished female in no acute distress.  HENT:  Normocephalic, atraumatic.  NECK: Normal range of motion, supple, no  masses.  Normal thyroid.  SKIN: Skin is warm and dry. No rash noted. Not diaphoretic. No erythema. No pallor. [place]: Alert and oriented to person, place, and time. PSYCHIATRIC: Normal mood and affect. Normal behavior. Normal judgment and thought content. CARDIOVASCULAR:Not Examined RESPIRATORY: Not Examined BREASTS: Not Examined ABDOMEN: Soft, non distended; Non tender.  No Organomegaly. PELVIC:  External Genitalia: Normal  BUS: Normal  Vagina: Normal  Cervix: Normal; no discharge;, nontender  Uterus: Normal size, shape,consistency, mobile no lesions; no cervical motion tenderness  Adnexa: Normal; no palpable masses or tenderness  RV: Normal external exam  Bladder:  Nontender MUSCULOSKELETAL: Normal range of motion. No tenderness.  No cyanosis, clubbing, or edema.     Assessment:   1. Threatened abortion - Beta HCG, Quant - US OB Transvaginal; Future - US OB Comp Less 14 Wks; Future - US Pelvis Complete; Future - US Transvaginal Non-OB; Future  2. Adnexal mass, left, 7 cm, asymptomatic, unclear if bowel or adnexal in origin     Plan:   1. Quantitative hCG today 2. Pelvic ultrasound on 03/26/2017 to assess left adnexal mass and confirm fetal viability 3. Threatened AB precautions given 4. Return after ultrasound for further management planning  Brayton Mars, MD  Note: This dictation was prepared with Dragon dictation along with smaller phrase technology. Any transcriptional errors that result from this process are unintentional.

## 2017-03-22 LAB — BETA HCG QUANT (REF LAB): hCG Quant: 12131 m[IU]/mL

## 2017-03-25 ENCOUNTER — Telehealth: Payer: Self-pay | Admitting: Obstetrics and Gynecology

## 2017-03-25 NOTE — Telephone Encounter (Signed)
Patient called requesting beta results.Thanks °

## 2017-03-25 NOTE — Telephone Encounter (Signed)
Pt aware beta is rising. Suboptimal. Advised to keep u/s appt.

## 2017-03-26 ENCOUNTER — Ambulatory Visit
Admission: RE | Admit: 2017-03-26 | Discharge: 2017-03-26 | Disposition: A | Discharge status: Home / Self Care | Payer: 59 | Source: Ambulatory Visit | Attending: Obstetrics and Gynecology | Admitting: Obstetrics and Gynecology

## 2017-03-26 DIAGNOSIS — R938 Abnormal findings on diagnostic imaging of other specified body structures: Secondary | ICD-10-CM | POA: Insufficient documentation

## 2017-03-26 DIAGNOSIS — O200 Threatened abortion: Secondary | ICD-10-CM | POA: Insufficient documentation

## 2017-03-26 DIAGNOSIS — Z3A01 Less than 8 weeks gestation of pregnancy: Secondary | ICD-10-CM | POA: Insufficient documentation

## 2017-04-03 ENCOUNTER — Ambulatory Visit (INDEPENDENT_AMBULATORY_CARE_PROVIDER_SITE_OTHER): Payer: 59

## 2017-04-03 DIAGNOSIS — O200 Threatened abortion: Secondary | ICD-10-CM

## 2017-04-08 ENCOUNTER — Encounter: Payer: 59 | Admitting: Obstetrics and Gynecology

## 2017-04-10 ENCOUNTER — Ambulatory Visit (INDEPENDENT_AMBULATORY_CARE_PROVIDER_SITE_OTHER): Payer: 59 | Admitting: Obstetrics and Gynecology

## 2017-04-10 ENCOUNTER — Encounter: Payer: Self-pay | Admitting: Obstetrics and Gynecology

## 2017-04-10 VITALS — BP 101/64 | HR 84 | Ht 68.0 in | Wt 242.1 lb

## 2017-04-10 DIAGNOSIS — Z3481 Encounter for supervision of other normal pregnancy, first trimester: Secondary | ICD-10-CM | POA: Diagnosis not present

## 2017-04-10 DIAGNOSIS — N949 Unspecified condition associated with female genital organs and menstrual cycle: Secondary | ICD-10-CM | POA: Diagnosis not present

## 2017-04-10 DIAGNOSIS — N9489 Other specified conditions associated with female genital organs and menstrual cycle: Secondary | ICD-10-CM

## 2017-04-10 NOTE — Patient Instructions (Signed)
1. Do not take ibuprofen for pain 2. Recommend Tylenol for left lower quadrant achiness as needed 3. Return on Monday for new OB nursing intake appointment 4. GYN oncology referral for evaluation of left adnexal mass is scheduled.

## 2017-04-10 NOTE — Progress Notes (Signed)
Chief complaint: 1. Left adnexal mass, predominantly solid 2. Follow-up on ultrasound  Patient presents for follow-up on pelvic ultrasound that was completed on 04/03/2017. Predominantly solid/complex masses identified measuring 7.3 cm.  Ultrasound report: ULTRASOUND REPORT  Location: [facility] Date of Service: 04/03/17  Indications:Dating and viabilty Findings:  Singleton intrauterine pregnancy is visualized with a CRL consistent with 7 4/[redacted] weeks gestation, giving an (U/S) EDD of 11/16/17. The (U/S) EDD is consistent with the clinically established (LMP) EDD of 11/14/17.  FHR: 155 CRL measurement: 13.4 mm Yolk sac and and early anatomy is normal.  Right Ovary measures 3.3 x 2.7 x 2.3 cm. It is normal in appearance. Left Ovary measures 8.4 x 7.9 x 6.3 cm. There Is a hyperechoic mass in the left adnexa possibly ovarian in origin.  Mass measures 7.3 x 6.2 x 6 cm. There is evidence of a corpus luteal cyst in the Right There is no free peritoneal fluid in the cul de sac.  Impression: 1. 7 4/7 week Viable Singleton Intrauterine pregnancy by U/S. 2. (U/S) EDD is consistent with Clinically established (LMP) EDD of 11/16/17. 3. There Is a complex hyperechoic mass in the left adnexa possibly ovarian in origin.  Mass measures 7.3 x 6.2 x 6 cm.  Recommendations: 1.Clinical correlation with the patient's History and Physical Exam. 2. Consider GYN oncology referral for further management recommendations.   Tyrone Apple  Brayton Mars, MD    SUBJECTIVE: Patient is now 8.[redacted] weeks pregnant. Patient reports occasional achiness at night. She has been experiencing crampiness which responds to ibuprofen. She has been encouraged to avoid taking further ibuprofen due to pregnancy status. She will take Tylenol from now on for management of pelvic discomfort. Bowel and bladder function are normal. Patient is not experiencing any vaginal bleeding. She may be noticing slight  increased awareness of skin since pregnancy confirmation, without increased acne or increased hair growth.  OBJECTIVE: BP 101/64   Pulse 84   Ht 5\' 8"  (1.727 m)   Wt 242 lb 1.6 oz (109.8 kg)   LMP 02/06/2017 (Exact Date)   BMI 36.81 kg/m  Pleasant African-American female in no acute distress. Alert and oriented. Abdomen: Soft, nontender without organomegaly. Moderate pannus Pelvic: Bimanual-midplane uterus, top normal size; adnexa without palpable mass or significant tenderness, possible slight sense of fullness in left adnexa  ASSESSMENT: 1. 8.4 week intrauterine pregnancy 2. 7.2 cm adnexal mass, left, primarily solid (complex), minimally symptomatic  PLAN: 1. Recommend GYN oncology referral for consultation regarding management during pregnancy. I suspect serial ultrasounds will be encouraged to follow the mass throughout pregnancy with more definitive follow-up after completion of pregnancy. We will follow GYN oncology recommendations for surgical surgical intervention sooner if deemed necessary. 2. Initiate routine prenatal care per protocol with OB nursing intake at [redacted] weeks gestation.  A total of 15 minutes were spent face-to-face with the patient during this encounter and over half of that time dealt with counseling and coordination of care.  Brayton Mars, MD  Note: This dictation was prepared with Dragon dictation along with smaller phrase technology. Any transcriptional errors that result from this process are unintentional.

## 2017-04-14 ENCOUNTER — Ambulatory Visit (INDEPENDENT_AMBULATORY_CARE_PROVIDER_SITE_OTHER): Payer: 59 | Admitting: Obstetrics and Gynecology

## 2017-04-14 VITALS — BP 131/80 | HR 84 | Ht 68.0 in | Wt 239.4 lb

## 2017-04-14 DIAGNOSIS — Z3A09 9 weeks gestation of pregnancy: Secondary | ICD-10-CM

## 2017-04-14 DIAGNOSIS — E663 Overweight: Secondary | ICD-10-CM

## 2017-04-14 NOTE — Patient Instructions (Signed)
How a Baby Grows During Pregnancy Pregnancy begins when a female's sperm enters a female's egg (fertilization). This happens in one of the tubes (fallopian tubes) that connect the ovaries to the womb (uterus). The fertilized egg is called an embryo until it reaches 10 weeks. From 10 weeks until birth, it is called a fetus. The fertilized egg moves down the fallopian tube to the uterus. Then it implants into the lining of the uterus and begins to grow. The developing fetus receives oxygen and nutrients through the pregnant woman's bloodstream and the tissues that grow (placenta) to support the fetus. The placenta is the life support system for the fetus. It provides nutrition and removes waste. Learning as much as you can about your pregnancy and how your baby is developing can help you enjoy the experience. It can also make you aware of when there might be a problem and when to ask questions. How long does a typical pregnancy last? A pregnancy usually lasts 280 days, or about 40 weeks. Pregnancy is divided into three trimesters:  First trimester: 0-13 weeks.  Second trimester: 14-27 weeks.  Third trimester: 28-40 weeks.  The day when your baby is considered ready to be born (full term) is your estimated date of delivery. How does my baby develop month by month? First month  The fertilized egg attaches to the inside of the uterus.  Some cells will form the placenta. Others will form the fetus.  The arms, legs, brain, spinal cord, lungs, and heart begin to develop.  At the end of the first month, the heart begins to beat.  Second month  The bones, inner ear, eyelids, hands, and feet form.  The genitals develop.  By the end of 8 weeks, all major organs are developing.  Third month  All of the internal organs are forming.  Teeth develop below the gums.  Bones and muscles begin to grow. The spine can flex.  The skin is transparent.  Fingernails and toenails begin to form.  Arms  and legs continue to grow longer, and hands and feet develop.  The fetus is about 3 in (7.6 cm) long.  Fourth month  The placenta is completely formed.  The external sex organs, neck, outer ear, eyebrows, eyelids, and fingernails are formed.  The fetus can hear, swallow, and move its arms and legs.  The kidneys begin to produce urine.  The skin is covered with a white waxy coating (vernix) and very fine hair (lanugo).  Fifth month  The fetus moves around more and can be felt for the first time (quickening).  The fetus starts to sleep and wake up and may begin to suck its finger.  The nails grow to the end of the fingers.  The organ in the digestive system that makes bile (gallbladder) functions and helps to digest the nutrients.  If your baby is a girl, eggs are present in her ovaries. If your baby is a boy, testicles start to move down into his scrotum.  Sixth month  The lungs are formed, but the fetus is not yet able to breathe.  The eyes open. The brain continues to develop.  Your baby has fingerprints and toe prints. Your baby's hair grows thicker.  At the end of the second trimester, the fetus is about 9 in (22.9 cm) long.  Seventh month  The fetus kicks and stretches.  The eyes are developed enough to sense changes in light.  The hands can make a grasping motion.  The  fetus responds to sound.  Eighth month  All organs and body systems are fully developed and functioning.  Bones harden and taste buds develop. The fetus may hiccup.  Certain areas of the brain are still developing. The skull remains soft.  Ninth month  The fetus gains about  lb (0.23 kg) each week.  The lungs are fully developed.  Patterns of sleep develop.  The fetus's head typically moves into a head-down position (vertex) in the uterus to prepare for birth. If the buttocks move into a vertex position instead, the baby is breech.  The fetus weighs 6-9 lbs (2.72-4.08 kg) and is  19-20 in (48.26-50.8 cm) long.  What can I do to have a healthy pregnancy and help my baby develop? Eating and Drinking  Eat a healthy diet. ? Talk with your health care provider to make sure that you are getting the nutrients that you and your baby need. ? Visit www.BuildDNA.es to learn about creating a healthy diet.  Gain a healthy amount of weight during pregnancy as advised by your health care provider. This is usually 25-35 pounds. You may need to: ? Gain more if you were underweight before getting pregnant or if you are pregnant with more than one baby. ? Gain less if you were overweight or obese when you got pregnant.  Medicines and Vitamins  Take prenatal vitamins as directed by your health care provider. These include vitamins such as folic acid, iron, calcium, and vitamin D. They are important for healthy development.  Take medicines only as directed by your health care provider. Read labels and ask a pharmacist or your health care provider whether over-the-counter medicines, supplements, and prescription drugs are safe to take during pregnancy.  Activities  Be physically active as advised by your health care provider. Ask your health care provider to recommend activities that are safe for you to do, such as walking or swimming.  Do not participate in strenuous or extreme sports.  Lifestyle  Do not drink alcohol.  Do not use any tobacco products, including cigarettes, chewing tobacco, or electronic cigarettes. If you need help quitting, ask your health care provider.  Do not use illegal drugs.  Safety  Avoid exposure to mercury, lead, or other heavy metals. Ask your health care provider about common sources of these heavy metals.  Avoid listeria infection during pregnancy. Follow these precautions: ? Do not eat soft cheeses or deli meats. ? Do not eat hot dogs unless they have been warmed up to the point of steaming, such as in the microwave oven. ? Do not  drink unpasteurized milk.  Avoid toxoplasmosis infection during pregnancy. Follow these precautions: ? Do not change your cat's litter box, if you have a cat. Ask someone else to do this for you. ? Wear gardening gloves while working in the yard.  General Instructions  Keep all follow-up visits as directed by your health care provider. This is important. This includes prenatal care and screening tests.  Manage any chronic health conditions. Work closely with your health care provider to keep conditions, such as diabetes, under control.  How do I know if my baby is developing well? At each prenatal visit, your health care provider will do several different tests to check on your health and keep track of your baby's development. These include:  Fundal height. ? Your health care provider will measure your growing belly from top to bottom using a tape measure. ? Your health care provider will also feel your belly  to determine your baby's position.  Heartbeat. ? An ultrasound in the first trimester can confirm pregnancy and show a heartbeat, depending on how far along you are. ? Your health care provider will check your baby's heart rate at every prenatal visit. ? As you get closer to your delivery date, you may have regular fetal heart rate monitoring to make sure that your baby is not in distress.  Second trimester ultrasound. ? This ultrasound checks your baby's development. It also indicates your baby's gender.  What should I do if I have concerns about my baby's development? Always talk with your health care provider about any concerns that you may have. This information is not intended to replace advice given to you by your health care provider. Make sure you discuss any questions you have with your health care provider. Document Released: 01/08/2008 Document Revised: 12/28/2015 Document Reviewed: 12/29/2013 Elsevier Interactive Patient Education  2018 [employer]. Common  Medications Safe in Pregnancy  Acne:      Constipation:  Benzoyl Peroxide     Colace  Clindamycin      Dulcolax Suppository  Topica Erythromycin     Fibercon  Salicylic Acid      Metamucil         Miralax AVOID:        Senakot   Accutane    Cough:  Retin-A       Cough Drops  Tetracycline      Phenergan w/ Codeine if Rx  Minocycline      Robitussin (Plain & DM)  Antibiotics:     Crabs/Lice:  Ceclor       RID  Cephalosporins    AVOID:  E-Mycins      Kwell  Keflex  Macrobid/Macrodantin   Diarrhea:  Penicillin      Kao-Pectate  Zithromax      Imodium AD         PUSH FLUIDS AVOID:       Cipro     Fever:  Tetracycline      Tylenol (Regular or Extra  Minocycline       Strength)  Levaquin      Extra Strength-Do not          Exceed 8 tabs/24 hrs Caffeine:        <259m/day (equiv. To 1 cup of coffee or  approx. 3 12 oz sodas)         Gas: Cold/Hayfever:       Gas-X  Benadryl      Mylicon  Claritin       Phazyme  **Claritin-D        Chlor-Trimeton    Headaches:  Dimetapp      ASA-Free Excedrin  Drixoral-Non-Drowsy     Cold Compress  Mucinex (Guaifenasin)     Tylenol (Regular or Extra  Sudafed/Sudafed-12 Hour     Strength)  **Sudafed PE Pseudoephedrine   Tylenol Cold & Sinus     Vicks Vapor Rub  Zyrtec  **AVOID if Problems With Blood Pressure         Heartburn: Avoid lying down for at least 1 hour after meals  Aciphex      Maalox     Rash:  Milk of Magnesia     Benadryl    Mylanta       1% Hydrocortisone Cream  Pepcid  Pepcid Complete   Sleep Aids:  Prevacid      Ambien   Prilosec       Benadryl  Rolaids       Chamomile Tea  Tums (Limit 4/day)     Unisom  Zantac       Tylenol PM         Warm milk-add vanilla or  Hemorrhoids:       Sugar for taste  Anusol/Anusol H.C.  (RX: Analapram 2.5%)  Sugar Substitutes:  Hydrocortisone OTC     Ok in moderation  Preparation H      Tucks        Vaseline lotion applied to tissue with  wiping    Herpes:     Throat:  Acyclovir      Oragel  Famvir  Valtrex     Vaccines:         Flu Shot Leg Cramps:       *Gardasil  Benadryl      Hepatitis A         Hepatitis B Nasal Spray:       Pneumovax  Saline Nasal Spray     Polio Booster         Tetanus Nausea:       Tuberculosis test or PPD  Vitamin B6 25 mg TID   AVOID:    Dramamine      *Gardasil  Emetrol       Live Poliovirus  Ginger Root 250 mg QID    MMR (measles, mumps &  High Complex Carbs @ Bedtime    rebella)  Sea Bands-Accupressure    Varicella (Chickenpox)  Unisom 1/2 tab TID     *No known complications           If received before Pain:         Known pregnancy;   Darvocet       Resume series after  Lortab        Delivery  Percocet    Yeast:   Tramadol      Femstat  Tylenol 3      Gyne-lotrimin  Ultram       Monistat  Vicodin           MISC:         All Sunscreens           Hair Coloring/highlights          Insect Repellant's          (Including DEET)         Mystic Tans First Trimester of Pregnancy The first trimester of pregnancy is from week 1 until the end of week 13 (months 1 through 3). A week after a sperm fertilizes an egg, the egg will implant on the wall of the uterus. This embryo will begin to develop into a baby. Genes from you and your partner will form the baby. The female genes will determine whether the baby will be a boy or a girl. At 6-8 weeks, the eyes and face will be formed, and the heartbeat can be seen on ultrasound. At the end of 12 weeks, all the baby's organs will be formed. Now that you are pregnant, you will want to do everything you can to have a healthy baby. Two of the most important things are to get good prenatal care and to follow your health care provider's instructions. Prenatal care is all the medical care you receive before the baby's birth. This care will help prevent, find, and treat any problems during the pregnancy and childbirth. Body changes during your first  trimester Your body goes through many  changes during pregnancy. The changes vary from woman to woman.  You may gain or lose a couple of pounds at first.  You may feel sick to your stomach (nauseous) and you may throw up (vomit). If the vomiting is uncontrollable, call your health care provider.  You may tire easily.  You may develop headaches that can be relieved by medicines. All medicines should be approved by your health care provider.  You may urinate more often. Painful urination may mean you have a bladder infection.  You may develop heartburn as a result of your pregnancy.  You may develop constipation because certain hormones are causing the muscles that push stool through your intestines to slow down.  You may develop hemorrhoids or swollen veins (varicose veins).  Your breasts may begin to grow larger and become tender. Your nipples may stick out more, and the tissue that surrounds them (areola) may become darker.  Your gums may bleed and may be sensitive to brushing and flossing.  Dark spots or blotches (chloasma, mask of pregnancy) may develop on your face. This will likely fade after the baby is born.  Your menstrual periods will stop.  You may have a loss of appetite.  You may develop cravings for certain kinds of food.  You may have changes in your emotions from day to day, such as being excited to be pregnant or being concerned that something may go wrong with the pregnancy and baby.  You may have more vivid and strange dreams.  You may have changes in your hair. These can include thickening of your hair, rapid growth, and changes in texture. Some women also have hair loss during or after pregnancy, or hair that feels dry or thin. Your hair will most likely return to normal after your baby is born.  What to expect at prenatal visits During a routine prenatal visit:  You will be weighed to make sure you and the baby are growing normally.  Your blood pressure  will be taken.  Your abdomen will be measured to track your baby's growth.  The fetal heartbeat will be listened to between weeks 10 and 14 of your pregnancy.  Test results from any previous visits will be discussed.  Your health care provider may ask you:  How you are feeling.  If you are feeling the baby move.  If you have had any abnormal symptoms, such as leaking fluid, bleeding, severe headaches, or abdominal cramping.  If you are using any tobacco products, including cigarettes, chewing tobacco, and electronic cigarettes.  If you have any questions.  Other tests that may be performed during your first trimester include:  Blood tests to find your blood type and to check for the presence of any previous infections. The tests will also be used to check for low iron levels (anemia) and protein on red blood cells (Rh antibodies). Depending on your risk factors, or if you previously had diabetes during pregnancy, you may have tests to check for high blood sugar that affects pregnant women (gestational diabetes).  Urine tests to check for infections, diabetes, or protein in the urine.  An ultrasound to confirm the proper growth and development of the baby.  Fetal screens for spinal cord problems (spina bifida) and Down syndrome.  HIV (human immunodeficiency virus) testing. Routine prenatal testing includes screening for HIV, unless you choose not to have this test.  You may need other tests to make sure you and the baby are doing well.  Follow these instructions  at home: Medicines  Follow your health care provider's instructions regarding medicine use. Specific medicines may be either safe or unsafe to take during pregnancy.  Take a prenatal vitamin that contains at least 600 micrograms (mcg) of folic acid.  If you develop constipation, try taking a stool softener if your health care provider approves. Eating and drinking  Eat a balanced diet that includes fresh fruits and  vegetables, whole grains, good sources of protein such as meat, eggs, or tofu, and low-fat dairy. Your health care provider will help you determine the amount of weight gain that is right for you.  Avoid raw meat and uncooked cheese. These carry germs that can cause birth defects in the baby.  Eating four or five small meals rather than three large meals a day may help relieve nausea and vomiting. If you start to feel nauseous, eating a few soda crackers can be helpful. Drinking liquids between meals, instead of during meals, also seems to help ease nausea and vomiting.  Limit foods that are high in fat and processed sugars, such as fried and sweet foods.  To prevent constipation: ? Eat foods that are high in fiber, such as fresh fruits and vegetables, whole grains, and beans. ? Drink enough fluid to keep your urine clear or pale yellow. Activity  Exercise only as directed by your health care provider. Most women can continue their usual exercise routine during pregnancy. Try to exercise for 30 minutes at least 5 days a week. Exercising will help you: ? Control your weight. ? Stay in shape. ? Be prepared for labor and delivery.  Experiencing pain or cramping in the lower abdomen or lower back is a good sign that you should stop exercising. Check with your health care provider before continuing with normal exercises.  Try to avoid standing for long periods of time. Move your legs often if you must stand in one place for a long time.  Avoid heavy lifting.  Wear low-heeled shoes and practice good posture.  You may continue to have sex unless your health care provider tells you not to. Relieving pain and discomfort  Wear a good support bra to relieve breast tenderness.  Take warm sitz baths to soothe any pain or discomfort caused by hemorrhoids. Use hemorrhoid cream if your health care provider approves.  Rest with your legs elevated if you have leg cramps or low back pain.  If you  develop varicose veins in your legs, wear support hose. Elevate your feet for 15 minutes, 3-4 times a day. Limit salt in your diet. Prenatal care  Schedule your prenatal visits by the twelfth week of pregnancy. They are usually scheduled monthly at first, then more often in the last 2 months before delivery.  Write down your questions. Take them to your prenatal visits.  Keep all your prenatal visits as told by your health care provider. This is important. Safety  Wear your seat belt at all times when driving.  Make a list of emergency phone numbers, including numbers for family, friends, the hospital, and police and fire departments. General instructions  Ask your health care provider for a referral to a local prenatal education class. Begin classes no later than the beginning of month 6 of your pregnancy.  Ask for help if you have counseling or nutritional needs during pregnancy. Your health care provider can offer advice or refer you to specialists for help with various needs.  Do not use hot tubs, steam rooms, or saunas.  Do  not douche or use tampons or scented sanitary pads.  Do not cross your legs for long periods of time.  Avoid cat litter boxes and soil used by cats. These carry germs that can cause birth defects in the baby and possibly loss of the fetus by miscarriage or stillbirth.  Avoid all smoking, herbs, alcohol, and medicines not prescribed by your health care provider. Chemicals in these products affect the formation and growth of the baby.  Do not use any products that contain nicotine or tobacco, such as cigarettes and e-cigarettes. If you need help quitting, ask your health care provider. You may receive counseling support and other resources to help you quit.  Schedule a dentist appointment. At home, brush your teeth with a soft toothbrush and be gentle when you floss. Contact a health care provider if:  You have dizziness.  You have mild pelvic cramps, pelvic  pressure, or nagging pain in the abdominal area.  You have persistent nausea, vomiting, or diarrhea.  You have a bad smelling vaginal discharge.  You have pain when you urinate.  You notice increased swelling in your face, hands, legs, or ankles.  You are exposed to fifth disease or chickenpox.  You are exposed to [place] measles (rubella) and have never had it. Get help right away if:  You have a fever.  You are leaking fluid from your vagina.  You have spotting or bleeding from your vagina.  You have severe abdominal cramping or pain.  You have rapid weight gain or loss.  You vomit blood or material that looks like coffee grounds.  You develop a severe headache.  You have shortness of breath.  You have any kind of trauma, such as from a fall or a car accident. Summary  The first trimester of pregnancy is from week 1 until the end of week 13 (months 1 through 3).  Your body goes through many changes during pregnancy. The changes vary from woman to woman.  You will have routine prenatal visits. During those visits, your health care provider will examine you, discuss any test results you may have, and talk with you about how you are feeling. This information is not intended to replace advice given to you by your health care provider. Make sure you discuss any questions you have with your health care provider. Document Released: 07/16/2001 Document Revised: 07/03/2016 Document Reviewed: 07/03/2016 Elsevier Interactive Patient Education  2017 [employer].

## 2017-04-14 NOTE — Progress Notes (Signed)
Joanne Shannon presents for NOB nurse interview visit. Pregnancy confirmation done [facility]. G- 2.  P-1 . Pregnancy education material explained and given. _No cats in the home. NOB labs ordered. (TSH/HbgA1c due to Increased BMI), (sickle cell). HIV labs and Drug screen were explained optional and she did not decline. Drug screen ordered. PNV encouraged. Genetic screening options discussed. Genetic testing /Unsure.  Pt may discuss with provider. Pt. To follow up with provider in 3 weeks with Dr. Marcelline Mates.

## 2017-04-15 LAB — MONITOR DRUG PROFILE 14(MW)
AMPHETAMINE SCREEN URINE: NEGATIVE ng/mL
BARBITURATE SCREEN URINE: NEGATIVE ng/mL
BENZODIAZEPINE SCREEN, URINE: NEGATIVE ng/mL
Buprenorphine, Urine: NEGATIVE ng/mL
CANNABINOIDS UR QL SCN: NEGATIVE ng/mL
COCAINE(METAB.)SCREEN, URINE: NEGATIVE ng/mL
CREATININE(CRT), U: 280.2 mg/dL (ref 20.0–300.0)
Fentanyl, Urine: NEGATIVE pg/mL
MEPERIDINE SCREEN, URINE: NEGATIVE ng/mL
METHADONE SCREEN, URINE: NEGATIVE ng/mL
OPIATE SCREEN URINE: NEGATIVE ng/mL
OXYCODONE+OXYMORPHONE UR QL SCN: NEGATIVE ng/mL
PROPOXYPHENE SCREEN URINE: NEGATIVE ng/mL
Ph of Urine: 5.6 (ref 4.5–8.9)
Phencyclidine Qn, Ur: NEGATIVE ng/mL
SPECIFIC GRAVITY: 1.02
TRAMADOL SCREEN, URINE: NEGATIVE ng/mL

## 2017-04-15 LAB — CBC WITH DIFFERENTIAL/PLATELET
BASOS: 0 %
Basophils Absolute: 0 10*3/uL (ref 0.0–0.2)
EOS (ABSOLUTE): 0 10*3/uL (ref 0.0–0.4)
Eos: 1 %
HEMATOCRIT: 33.6 % — AB (ref 34.0–46.6)
Hemoglobin: 11.1 g/dL (ref 11.1–15.9)
IMMATURE GRANS (ABS): 0 10*3/uL (ref 0.0–0.1)
Immature Granulocytes: 0 %
Lymphocytes Absolute: 1.7 10*3/uL (ref 0.7–3.1)
Lymphs: 31 %
MCH: 27.8 pg (ref 26.6–33.0)
MCHC: 33 g/dL (ref 31.5–35.7)
MCV: 84 fL (ref 79–97)
MONOS ABS: 0.4 10*3/uL (ref 0.1–0.9)
Monocytes: 7 %
NEUTROS ABS: 3.3 10*3/uL (ref 1.4–7.0)
Neutrophils: 61 %
Platelets: 299 10*3/uL (ref 150–379)
RBC: 4 x10E6/uL (ref 3.77–5.28)
RDW: 13.8 % (ref 12.3–15.4)
WBC: 5.5 10*3/uL (ref 3.4–10.8)

## 2017-04-15 LAB — URINALYSIS, ROUTINE W REFLEX MICROSCOPIC
BILIRUBIN UA: NEGATIVE
GLUCOSE, UA: NEGATIVE
Leukocytes, UA: NEGATIVE
NITRITE UA: NEGATIVE
Protein, UA: NEGATIVE
RBC UA: NEGATIVE
SPEC GRAV UA: 1.023 (ref 1.005–1.030)
Urobilinogen, Ur: 0.2 mg/dL (ref 0.2–1.0)
pH, UA: 5.5 (ref 5.0–7.5)

## 2017-04-15 LAB — HEMOGLOBIN A1C
Est. average glucose Bld gHb Est-mCnc: 111 mg/dL
HEMOGLOBIN A1C: 5.5 % (ref 4.8–5.6)

## 2017-04-15 LAB — GC/CHLAMYDIA PROBE AMP
Chlamydia trachomatis, NAA: NEGATIVE
Neisseria gonorrhoeae by PCR: NEGATIVE

## 2017-04-15 LAB — HIV ANTIBODY (ROUTINE TESTING W REFLEX): HIV SCREEN 4TH GENERATION: NONREACTIVE

## 2017-04-15 LAB — SICKLE CELL SCREEN: SICKLE CELL SCREEN: POSITIVE — AB

## 2017-04-15 LAB — VARICELLA ZOSTER ANTIBODY, IGG: Varicella zoster IgG: 1248 index (ref >165)

## 2017-04-15 LAB — RUBELLA SCREEN: Rubella Antibodies, IGG: 2.37 index (ref >0.99)

## 2017-04-15 LAB — TSH: TSH: 0.394 u[IU]/mL — ABNORMAL LOW (ref 0.450–4.500)

## 2017-04-15 LAB — ANTIBODY SCREEN: Antibody Screen: NEGATIVE

## 2017-04-15 LAB — RPR: RPR Ser Ql: NONREACTIVE

## 2017-04-15 LAB — HEPATITIS B SURFACE ANTIGEN: HEP B S AG: NEGATIVE

## 2017-04-16 ENCOUNTER — Inpatient Hospital Stay: Payer: 59 | Attending: Obstetrics and Gynecology | Admitting: Obstetrics and Gynecology

## 2017-04-16 ENCOUNTER — Encounter: Payer: Self-pay | Admitting: Obstetrics and Gynecology

## 2017-04-16 ENCOUNTER — Inpatient Hospital Stay: Payer: 59

## 2017-04-16 VITALS — BP 137/74 | HR 77 | Temp 97.8°F | Resp 20 | Wt 242.2 lb

## 2017-04-16 DIAGNOSIS — R1900 Intra-abdominal and pelvic swelling, mass and lump, unspecified site: Secondary | ICD-10-CM

## 2017-04-16 DIAGNOSIS — Z3A09 9 weeks gestation of pregnancy: Secondary | ICD-10-CM | POA: Insufficient documentation

## 2017-04-16 DIAGNOSIS — O200 Threatened abortion: Secondary | ICD-10-CM | POA: Insufficient documentation

## 2017-04-16 DIAGNOSIS — Z87891 Personal history of nicotine dependence: Secondary | ICD-10-CM | POA: Diagnosis not present

## 2017-04-16 LAB — LACTATE DEHYDROGENASE: LDH: 110 U/L (ref 98–192)

## 2017-04-16 LAB — URINE CULTURE: ORGANISM ID, BACTERIA: NO GROWTH

## 2017-04-16 NOTE — Progress Notes (Signed)
  Oncology Nurse Navigator Documentation Chaperoned pelvic exam.  Navigator Location: [facility] (04/16/17 1400)   )Navigator Encounter Type: Initial GynOnc (04/16/17 1400)                     Patient Visit Type: GynOnc (04/16/17 1400)                              Time Spent with Patient: 15 (04/16/17 1400)

## 2017-04-16 NOTE — Progress Notes (Signed)
Patient here today for initial evaluation regarding adnexal mass. Patient is currently [redacted] weeks pregnant, reports pain in left side that is 3/10.

## 2017-04-16 NOTE — Progress Notes (Signed)
Gynecologic Oncology Consult Visit   Referring Provider: Dr DeFrancesco  Chief Concern: Pelvic mass in pregnancy  Subjective:  Joanne Shannon is a 31 y.o. female with desired pregnancy who is seen in consultation for Pelvic mass at 9 weeks of pregnancy.   Seen in [facility] emergency room referral for threatened abortion with elevated quantitative hCG in 03/18/2017 ([phone]). US IMPRESSION: 1. Probable early intrauterine gestational sac with a yolk sac, but no fetal pole or cardiac activity yet visualized. Recommend follow-up quantitative B-HCG levels and follow-up US in 10-14 days to assess viability. This recommendation follows SRU consensus guidelines: Diagnostic Criteria for Nonviable Pregnancy Early in the First Trimester. [employer] Med 2013; 756:4332-95. 2. Round heterogeneous 7 cm echogenic structure in the left adnexa, with nonvisualization of left ovary. Differential considerations include left ovarian mass versus non peristalsing bowel. This can be re-assessed on follow-up ultrasound.  Seen by Dr Enzo Bi 03/21/17. No further bleeding at that time Ultrasound 03/18/2017:  IMPRESSION: 1. Probable early intrauterine gestational sac with a yolk sac, but no fetal pole or cardiac activity yet visualized. Recommend follow-up quantitative B-HCG levels and follow-up US in 10-14 days to assess viability. This recommendation follows SRU consensus guidelines: Diagnostic Criteria for Nonviable Pregnancy Early in the First Trimester. [employer] Med 2013; 188:4166-06. 2. Round heterogeneous 7 cm echogenic structure in the left adnexa, with nonvisualization of left ovary. Differential considerations include left ovarian mass versus non peristalsing bowel. This can be re-assessed on follow-up ultrasound.  Had another ultrasound done. ULTRASOUND REPORT Location: [facility] Date of Service: 04/03/17 Indications:Dating and viabilty Findings: Singleton intrauterine pregnancy is  visualized with a CRL consistent with 7 4/[redacted] weeks gestation, giving an (U/S) EDD of 11/16/17. The (U/S) EDD is consistent with the clinically established (LMP) EDD of 11/14/17. FHR: 155 CRL measurement: 13.4 mm Yolk sac and and early anatomy is normal. Right Ovary measures 3.3 x 2.7 x 2.3 cm. It is normal in appearance. Left Ovary measures 8.4 x 7.9 x 6.3 cm. There Is a hyperechoic mass in the left adnexa possibly ovarian in origin. Mass measures 7.3 x 6.2 x 6 cm. There is evidence of a corpus luteal cyst in the Right There is no free peritoneal fluid in the cul de sac.  Impression: 1. 7 4/7 week Viable Singleton Intrauterine pregnancy by U/S. 2. (U/S) EDD is consistent with Clinically established (LMP) EDD of 11/16/17. 3. There Is a complex hyperechoic mass in the left adnexa possibly ovarian in origin. Mass measures 7.3 x 6.2 x 6 cm.  Recommendations: 1.Clinical correlation with the patient's History and Physical Exam. 2. Consider GYN oncology referral for further management recommendations.  Saw Dr Enzo Bi 04/10/17. Recommend GYN oncology referral for consultation regarding management during pregnancy. I suspect serial ultrasounds will be encouraged to follow the mass throughout pregnancy with more definitive follow-up after completion of pregnancy. We will follow GYN oncology recommendations for surgical surgical intervention sooner if deemed necessary.   Patient doing well.  No further bleeding or discharge.  Has intermittent dull ache in left lower abdomen.   Problem List: Patient Active Problem List   Diagnosis Date Noted  . Threatened abortion 03/21/2017  . Adnexal mass 03/21/2017    Past Medical History: Past Medical History:  Diagnosis Date  . Anxiety   . Enlarged thyroid     Past Surgical History: Past Surgical History:  Procedure Laterality Date  . NO PAST SURGERIES     OB History:  OB History  Gravida Para Term  Preterm AB Living  2 1 1     1   SAB TAB  Ectopic Multiple Live Births          1    # Outcome Date GA Lbr Len/2nd Weight Sex Delivery Anes PTL Lv  2 Current           1 Term 2008   7 lb 3.2 oz (3.266 kg) M Vag-Spont   LIV      Family History: Family History  Problem Relation Age of Onset  . Diabetes Sister   . Breast cancer Neg Hx   . Ovarian cancer Neg Hx   . Colon cancer Neg Hx     Social History: Social History   Social History  . Marital status: Single    Spouse name: N/A  . Number of children: N/A  . Years of education: N/A   Occupational History  . Not on file.   Social History Main Topics  . Smoking status: Former Smoker    Types: Cigars  . Smokeless tobacco: Never Used  . Alcohol use No  . Drug use: No  . Sexual activity: Yes    Birth control/ protection: None   Other Topics Concern  . Not on file   Social History Narrative  . No narrative on file    Allergies: No Known Allergies  Current Medications: Current Outpatient Prescriptions  Medication Sig Dispense Refill  . folic acid (FOLVITE) 1 MG tablet Take by mouth.     No current facility-administered medications for this visit.     Review of Systems General: negative for, fevers, chills, fatigue, changes in sleep, changes in weight or appetite Skin: negative for changes in color, texture, moles or lesions Eyes: negative for, changes in vision, pain, diplopia HEENT: negative for, change in hearing, pain, discharge, tinnitus, vertigo, voice changes, sore throat, neck masses Breasts: negative for breast lumps Pulmonary: negative for, dyspnea, orthopnea, productive cough Cardiac: negative for, palpitations, syncope, pain, discomfort, pressure Gastrointestinal: negative for, dysphagia, nausea, vomiting, jaundice, pain, constipation, diarrhea, hematemesis, hematochezia Genitourinary/Sexual: negative for, dysuria, discharge, hesitancy, nocturia, retention, stones, infections, STD's, incontinence Ob/Gyn: negative for  bleeding Musculoskeletal: negative for, pain, stiffness, swelling, range of motion limitation Hematology: negative for, easy bruising, bleeding Neurologic/Psych: negative for, headaches, seizures, paralysis, weakness, tremor, change in gait, change in sensation, mood swings, depression, anxiety, change in memory  Objective:  Physical Examination:  BP 137/74   Pulse 77   Temp 97.8 F (36.6 C) (Tympanic)   Resp 20   Wt 242 lb 3.2 oz (109.9 kg)   LMP 02/07/2017   BMI 36.83 kg/m    ECOG Performance Status: 0 - Asymptomatic  General appearance: alert, cooperative and appears stated age HEENT:PERRLA, neck supple with midline trachea and thyroid without masses Lymph node survey: non-palpable, axillary, inguinal, supraclavicular Cardiovascular: regular rate and rhythm, no murmurs or gallops Respiratory: normal air entry, lungs clear to auscultation and no rales, rhonchi or wheezing Breast exam: not examined. Abdomen: soft, non-tender, without masses or organomegaly, no hernias and well healed incision Back: inspection of back is normal Extremities: extremities normal, atraumatic, no cyanosis or edema Skin exam - normal coloration and turgor, no rashes, no suspicious skin lesions noted. Neurological exam reveals alert, oriented, normal speech, no focal findings or movement disorder noted.  Pelvic: exam chaperoned by nurse;  Vulva: normal appearing vulva with no masses, tenderness or lesions; Vagina: normal vagina; Adnexa: normal adnexa in size, nontender and no masses; Uterus: uterus is normal size, shape, consistency  and nontender; Cervix: anteverted; Rectal: normal rectal, no masses    Assessment:  Joanne Shannon is a 31 y.o. G2P1 female diagnosed with 7 cm solid left adnexal mass at 9 weeks of desired pregnancy.  Most likely this solid mass is a benign, fibroma, endometrioma, dermoid, myoma or corpus luteum.  However, malignancy cannot be ruled out and this could be a sex cord stromal  tumor or germ cell tumor, but is less likely epithelial ovarian cancer at her age.  The mass did not appear to grow over the course of three ultrasounds done from 8/14 to 8/30, but this is a relatively short period of time.  Plan:   Problem List Items Addressed This Visit    None    Visit Diagnoses    Pelvic mass    -  Primary      We discussed options for management including surveillance versus surgical intervention.  Most masses in pregnancy are benign, but there is a risk of ovarian torsion or pain and obstruction of labor if the mass got a lot larger.  Presently she does not have significant symptoms other than mild intermittent LLQ discomfort.  Will check CA125, LDH, AFP.  These markers can be elevated in pregnancy, but would be reassuring if normal. And if elevated the trend can be followed over time.  Suggested return to clinic in  3 weeks for repeat US with plan to do laparoscopic surgery in the early to mid second trimester if the mass is enlarging. Explained that surgery was safest in second trimester with lowest chance of pregnancy loss.  If mass does not enlarge it may be reasonable to follow with [place] through pregnancy and then remove it later.  This could be done at the time of C section if this occurs or 6 weeks post partum.  I will also discuss the case with my partners and perhaps it may be worthwhile to do an MRI abd/pelvis to better characterize the mass and look for adenopathy or other evidence of metastatic disease.   The patient's diagnosis, an outline of the further diagnostic and laboratory studies which will be required, the recommendation, and alternatives were discussed.  All questions were answered to the patient's satisfaction.  A total of 40 minutes were spent with the patient/family today; 50% was spent in education, counseling and coordination of care for pelvic mass.    Mellody Drown, MD  CC:  Shari Prows, [facility] [place], [place]  [phone]

## 2017-04-17 LAB — CA 125: CANCER ANTIGEN (CA) 125: 23.4 U/mL (ref 0.0–38.1)

## 2017-04-17 LAB — AFP TUMOR MARKER: AFP, Serum, Tumor Marker: 7.5 ng/mL (ref 0.0–8.3)

## 2017-04-30 ENCOUNTER — Other Ambulatory Visit: Payer: 59

## 2017-04-30 ENCOUNTER — Ambulatory Visit (INDEPENDENT_AMBULATORY_CARE_PROVIDER_SITE_OTHER): Payer: 59 | Admitting: Obstetrics and Gynecology

## 2017-04-30 ENCOUNTER — Encounter: Payer: Self-pay | Admitting: Obstetrics and Gynecology

## 2017-04-30 VITALS — BP 116/73 | HR 76 | Wt 241.1 lb

## 2017-04-30 DIAGNOSIS — Z3481 Encounter for supervision of other normal pregnancy, first trimester: Secondary | ICD-10-CM | POA: Diagnosis not present

## 2017-04-30 DIAGNOSIS — N949 Unspecified condition associated with female genital organs and menstrual cycle: Secondary | ICD-10-CM

## 2017-04-30 DIAGNOSIS — E049 Nontoxic goiter, unspecified: Secondary | ICD-10-CM

## 2017-04-30 DIAGNOSIS — R946 Abnormal results of thyroid function studies: Secondary | ICD-10-CM

## 2017-04-30 DIAGNOSIS — N9489 Other specified conditions associated with female genital organs and menstrual cycle: Secondary | ICD-10-CM

## 2017-04-30 DIAGNOSIS — Z1379 Encounter for other screening for genetic and chromosomal anomalies: Secondary | ICD-10-CM

## 2017-04-30 DIAGNOSIS — R7989 Other specified abnormal findings of blood chemistry: Secondary | ICD-10-CM

## 2017-04-30 DIAGNOSIS — E669 Obesity, unspecified: Secondary | ICD-10-CM

## 2017-04-30 LAB — POCT URINALYSIS DIPSTICK
BILIRUBIN UA: NEGATIVE
GLUCOSE UA: NEGATIVE
KETONES UA: NEGATIVE
Nitrite, UA: NEGATIVE
PH UA: 6 (ref 5.0–8.0)
Protein, UA: NEGATIVE
SPEC GRAV UA: 1.025 (ref 1.010–1.025)
Urobilinogen, UA: 0.2 E.U./dL

## 2017-04-30 NOTE — Progress Notes (Signed)
OBSTETRIC INITIAL PRENATAL VISIT  Subjective:    Joanne Shannon is being seen today for her first obstetrical visit.  This is a planned pregnancy. She is a G28P1001 female at [redacted]w[redacted]d gestation, Estimated Date of Delivery: 11/14/17 with Patient's last menstrual period was 02/07/2017, consistent with 7 week sono. Her obstetrical history is significant for sickle cell trait (however FOB negative for trait), and large ovarian cyst in early pregnancy (~ 7 cm).  Relationship with FOB: significant other, living together. Patient does not intend to brteast feed. Pregnancy history fully reviewed.    Obstetric History   G2   P1   T1   P0   A0   L1    SAB0   TAB0   Ectopic0   Multiple0   Live Births1     # Outcome Date GA Lbr Len/2nd Weight Sex Delivery Anes PTL Lv  2 Current           1 Term 2008 [redacted]w[redacted]d  7 lb 3.2 oz (3.266 kg) M Vag-Spont   LIV      Gynecologic History:  Last pap smear was 01/2017.  Results were normal.  Denies h/o abnormal pap smaers in the past.  Denies history of STIs.    Past Medical History:  Diagnosis Date  . Anxiety   . Enlarged thyroid     Family History  Problem Relation Age of Onset  . Diabetes Sister 69  . Graves' disease Father   . Breast cancer Neg Hx   . Ovarian cancer Neg Hx   . Colon cancer Neg Hx      Past Surgical History:  Procedure Laterality Date  . NO PAST SURGERIES       Social History   Social History  . Marital status: Single    Spouse name: N/A  . Number of children: N/A  . Years of education: N/A   Occupational History  . Not on file.   Social History Main Topics  . Smoking status: Former Smoker    Types: Cigars  . Smokeless tobacco: Never Used  . Alcohol use No  . Drug use: No  . Sexual activity: Yes    Birth control/ protection: None   Other Topics Concern  . Not on file   Social History Narrative  . No narrative on file     Current Outpatient Prescriptions on File Prior to Visit  Medication Sig Dispense  Refill  . folic acid (FOLVITE) 1 MG tablet Take by mouth.     No current facility-administered medications on file prior to visit.      No Known Allergies   Review of Systems General:Not Present- Fever, Weight Loss and Weight Gain. Skin:Not Present- Rash. HEENT:Not Present- Blurred Vision, Headache and Bleeding Gums. Respiratory:Not Present- Difficulty Breathing. Breast:Not Present- Breast Mass. Cardiovascular:Not Present- Chest Pain, Elevated Blood Pressure, Fainting / Blacking Out and Shortness of Breath. Gastrointestinal:Not Present- Abdominal Pain, Constipation, Nausea and Vomiting. Female Genitourinary:Not Present- Frequency, Painful Urination, Pelvic Pain, Vaginal Bleeding, Vaginal Discharge, Contractions, regular, Fetal Movements Decreased, Urinary Complaints and Vaginal Fluid. Musculoskeletal:Not Present- Back Pain and Leg Cramps. Neurological:Not Present- Dizziness. Psychiatric:Not Present- Depression.     Objective:   Blood pressure 116/73, pulse 76, weight 241 lb 1.6 oz (109.4 kg), last menstrual period 02/07/2017.  Body mass index is 36.66 kg/m.  General Appearance:    Alert, cooperative, no distress, appears stated age, moderate obesity  Head:    Normocephalic, without obvious abnormality, atraumatic  Eyes:    PERRL, conjunctiva/corneas  clear, EOM's intact, both eyes  Ears:    Normal external ear canals, both ears  Nose:   Nares normal, septum midline, mucosa normal, no drainage or sinus tenderness  Throat:   Lips, mucosa, and tongue normal; teeth and gums normal  Neck:   Supple, symmetrical, trachea midline, no adenopathy; thyroid: no enlargement/tenderness/nodules; no carotid bruit or JVD  Back:     Symmetric, no curvature, ROM normal, no CVA tenderness  Lungs:     Clear to auscultation bilaterally, respirations unlabored  Chest Wall:    No tenderness or deformity   Heart:    Regular rate and rhythm, S1 and S2 normal, no murmur, rub or gallop  Breast  Exam:    No tenderness, masses, or nipple abnormality  Abdomen:     Soft, non-tender, bowel sounds active all four quadrants, no masses, no organomegaly.  FH 12 cm.  FHT 168 bpm.  Genitalia:    Pelvic:external genitalia normal, vagina without lesions, discharge, or tenderness, rectovaginal septum  normal. Cervix normal in appearance, no cervical motion tenderness, no adnexal masses or tenderness but fullness palpated on left.  Pregnancy positive findings: uterine enlargement: 12 wk size, nontender.   Rectal:    Normal external sphincter.  No hemorrhoids appreciated. Internal exam not done.   Extremities:   Extremities normal, atraumatic, no cyanosis or edema  Skin:   Skin color, texture, turgor normal, no rashes or lesions  Lymph nodes:   Cervical, supraclavicular, and axillary nodes normal  Neurologic:   CNII-XII intact, normal strength, sensation and reflexes throughout     Imaging:  ULTRASOUND REPORT  Location: [facility] Date of Service: 04/03/17  Indications:Dating and viabilty Findings:  Singleton intrauterine pregnancy is visualized with a CRL consistent with 7 4/[redacted] weeks gestation, giving an (U/S) EDD of 11/16/17. The (U/S) EDD is consistent with the clinically established (LMP) EDD of 11/14/17.  FHR: 155 CRL measurement: 13.4 mm Yolk sac and and early anatomy is normal.  Right Ovary measures 3.3 x 2.7 x 2.3 cm. It is normal in appearance. Left Ovary measures 8.4 x 7.9 x 6.3 cm. There Is a hyperechoic mass in the left adnexa possibly ovarian in origin.  Mass measures 7.3 x 6.2 x 6 cm. There is evidence of a corpus luteal cyst in the Right There is no free peritoneal fluid in the cul de sac.  Impression: 1. 7 4/7 week Viable Singleton Intrauterine pregnancy by U/S. 2. (U/S) EDD is consistent with Clinically established (LMP) EDD of 11/16/17. 3. There Is a complex hyperechoic mass in the left adnexa possibly ovarian in origin.  Mass measures 7.3 x 6.2 x 6  cm.  Recommendations: 1.Clinical correlation with the patient's History and Physical Exam. 2. Consider GYN oncology referral for further management recommendations.   Pauline Good, MD    Labs:  Results for TAIMA, RADA (MRN [id number]) as of 05/03/2017 09:01  Ref. Range 04/16/2017 14:11  AFP, Serum, Tumor Marker Latest Ref Range: 0.0 - 8.3 ng/mL 7.5  Cancer Antigen (CA) 125 Latest Ref Range: 0.0 - 38.1 U/mL 23.4    Lab Results  Component Value Date   TSH 1.060 04/30/2017   T4TOTAL 7.5 04/30/2017    Assessment:    Pregnancy at 11 and 5/7 weeks  Left ovarian cyst Goiter Moderate obesity  Plan:   Initial labs reviewed. Prenatal vitamins encouraged. Problem list reviewed and updated. New OB counseling:  The patient has been given an overview regarding routine prenatal care.  Recommendations regarding diet, weight gain, and exercise in pregnancy were given. Prenatal testing, optional genetic testing, and ultrasound use in pregnancy were reviewed.  Desires MaterniT21 testing.  Benefits of Breast Feeding were discussed. The patient is encouraged to consider nursing her baby post partum. Large left ovarian cyst, currently asymptomatic. Patient has repeat ultrasound scheduled for next week, as well as referral to GYN oncology due to nature of cyst.  Current tumor markers negative.  Moderate obesity with family h/o possible diabetes, will need early glucola at 47-18 weeks.  Follow up in 4 weeks. H/o goiter, with abnormal thyroid levels. Will check hormone levels today, and each trimester to assess for thyroid disease as patient is at risk during pregnancy.    50% of 30 min visit spent on counseling and coordination of care.     Rubie Maid, MD [facility]

## 2017-05-01 LAB — THYROID PANEL WITH TSH
Free Thyroxine Index: 1.6 (ref 1.2–4.9)
T3 UPTAKE RATIO: 21 % — AB (ref 24–39)
T4, Total: 7.5 ug/dL (ref 4.5–12.0)
TSH: 1.06 u[IU]/mL (ref 0.450–4.500)

## 2017-05-03 DIAGNOSIS — E049 Nontoxic goiter, unspecified: Secondary | ICD-10-CM | POA: Insufficient documentation

## 2017-05-03 DIAGNOSIS — E669 Obesity, unspecified: Secondary | ICD-10-CM | POA: Insufficient documentation

## 2017-05-04 LAB — MATERNIT 21 PLUS CORE, BLOOD
CHROMOSOME 18: NEGATIVE
CHROMOSOME 21: NEGATIVE
Chromosome 13: NEGATIVE
Y CHROMOSOME: NOT DETECTED

## 2017-05-05 ENCOUNTER — Other Ambulatory Visit: Payer: Self-pay | Admitting: Obstetrics and Gynecology

## 2017-05-05 ENCOUNTER — Encounter: Payer: Self-pay | Admitting: Obstetrics and Gynecology

## 2017-05-05 DIAGNOSIS — E669 Obesity, unspecified: Secondary | ICD-10-CM

## 2017-05-05 DIAGNOSIS — Z833 Family history of diabetes mellitus: Secondary | ICD-10-CM

## 2017-05-05 DIAGNOSIS — Z3483 Encounter for supervision of other normal pregnancy, third trimester: Secondary | ICD-10-CM

## 2017-05-06 ENCOUNTER — Other Ambulatory Visit: Payer: Self-pay | Admitting: Obstetrics and Gynecology

## 2017-05-06 ENCOUNTER — Ambulatory Visit
Admission: RE | Admit: 2017-05-06 | Discharge: 2017-05-06 | Disposition: A | Discharge status: Home / Self Care | Payer: 59 | Source: Ambulatory Visit | Attending: Obstetrics and Gynecology | Admitting: Obstetrics and Gynecology

## 2017-05-06 DIAGNOSIS — R1900 Intra-abdominal and pelvic swelling, mass and lump, unspecified site: Secondary | ICD-10-CM

## 2017-05-07 ENCOUNTER — Inpatient Hospital Stay: Payer: 59 | Attending: Obstetrics and Gynecology | Admitting: Obstetrics and Gynecology

## 2017-05-07 VITALS — BP 146/83 | HR 79 | Temp 97.8°F | Resp 18 | Wt 240.0 lb

## 2017-05-07 DIAGNOSIS — O09891 Supervision of other high risk pregnancies, first trimester: Secondary | ICD-10-CM | POA: Insufficient documentation

## 2017-05-07 DIAGNOSIS — D271 Benign neoplasm of left ovary: Secondary | ICD-10-CM | POA: Insufficient documentation

## 2017-05-07 DIAGNOSIS — R1900 Intra-abdominal and pelvic swelling, mass and lump, unspecified site: Secondary | ICD-10-CM

## 2017-05-07 DIAGNOSIS — O99281 Endocrine, nutritional and metabolic diseases complicating pregnancy, first trimester: Secondary | ICD-10-CM | POA: Diagnosis not present

## 2017-05-07 DIAGNOSIS — Z79891 Long term (current) use of opiate analgesic: Secondary | ICD-10-CM | POA: Diagnosis not present

## 2017-05-07 DIAGNOSIS — Z87891 Personal history of nicotine dependence: Secondary | ICD-10-CM | POA: Diagnosis not present

## 2017-05-07 DIAGNOSIS — E049 Nontoxic goiter, unspecified: Secondary | ICD-10-CM | POA: Insufficient documentation

## 2017-05-07 DIAGNOSIS — E669 Obesity, unspecified: Secondary | ICD-10-CM | POA: Diagnosis not present

## 2017-05-07 NOTE — Progress Notes (Signed)
Here for follow up

## 2017-05-07 NOTE — Progress Notes (Signed)
Gynecologic Oncology Consult Visit   Referring Provider: Dr DeFrancesco  Chief Concern: Pelvic mass in pregnancy  Subjective:  Joanne Shannon is a 31 y.o. female with desired pregnancy who is seen in consultation for Pelvic mass in first trimester of pregnancy.  Patient returns for follow up of adnexal mass and [place] yesterday showed no change in size (6.7 x 5.0 x 6.4 cm) of primarily hyperechoic left ovarian mass.  CA125, AFP, LDH were normal two weeks ago.  She is not having a lot of pelvic pain, just expected nausea.  History Seen in [facility] emergency room referral for threatened abortion with elevated quantitative hCG in 03/18/2017 (9,048). US IMPRESSION: 1. Probable early intrauterine gestational sac with a yolk sac, but no fetal pole or cardiac activity yet visualized. Recommend follow-up quantitative B-HCG levels and follow-up US in 10-14 days to assess viability. This recommendation follows SRU consensus guidelines: Diagnostic Criteria for Nonviable Pregnancy Early in the First Trimester. [employer] 2013; 034:7425-95. 2. Round heterogeneous 7 cm echogenic structure in the left adnexa, with nonvisualization of left ovary. Differential considerations include left ovarian mass versus non peristalsing bowel. This can be re-assessed on follow-up ultrasound.  Seen by Dr Enzo Bi 03/21/17. No further bleeding at that time Ultrasound 03/18/2017:  IMPRESSION: 1. Probable early intrauterine gestational sac with a yolk sac, but no fetal pole or cardiac activity yet visualized. Recommend follow-up quantitative B-HCG levels and follow-up US in 10-14 days to assess viability. This recommendation follows SRU consensus guidelines: Diagnostic Criteria for Nonviable Pregnancy Early in the First Trimester. [employer] 2013; 638:7564-33. 2. Round heterogeneous 7 cm echogenic structure in the left adnexa, with nonvisualization of left ovary. Differential considerations include left ovarian  mass versus non peristalsing bowel. This can be re-assessed on follow-up ultrasound.  Had another ultrasound done. ULTRASOUND REPORT Location: [facility] Date of Service: 04/03/17 Indications:Dating and viabilty Findings: Singleton intrauterine pregnancy is visualized with a CRL consistent with 7 4/[redacted] weeks gestation, giving an (U/S) EDD of 11/16/17. The (U/S) EDD is consistent with the clinically established (LMP) EDD of 11/14/17. FHR: 155 CRL measurement: 13.4 mm Yolk sac and and early anatomy is normal. Right Ovary measures 3.3 x 2.7 x 2.3 cm. It is normal in appearance. Left Ovary measures 8.4 x 7.9 x 6.3 cm. There Is a hyperechoic mass in the left adnexa possibly ovarian in origin. Mass measures 7.3 x 6.2 x 6 cm. There is evidence of a corpus luteal cyst in the Right There is no free peritoneal fluid in the cul de sac.  Impression: 1. 7 4/7 week Viable Singleton Intrauterine pregnancy by U/S. 2. (U/S) EDD is consistent with Clinically established (LMP) EDD of 11/16/17. 3. There Is a complex hyperechoic mass in the left adnexa possibly ovarian in origin. Mass measures 7.3 x 6.2 x 6 cm.  Recommendations: 1.Clinical correlation with the patient's History and Physical Exam. 2. Consider GYN oncology referral for further management recommendations.  Saw Dr Enzo Bi 04/10/17. Recommend GYN oncology referral for consultation regarding management during pregnancy. I suspect serial ultrasounds will be encouraged to follow the mass throughout pregnancy with more definitive follow-up after completion of pregnancy. We will follow GYN oncology recommendations for surgical surgical intervention sooner if deemed necessary.   Patient doing well.  No further bleeding or discharge.  Has intermittent dull ache in left lower abdomen.   Problem List: Patient Active Problem List   Diagnosis Date Noted  . Goiter 05/03/2017  . Obesity (BMI 30-39.9) 05/03/2017  .  Threatened abortion  03/21/2017  . Adnexal mass 03/21/2017    Past Medical History: Past Medical History:  Diagnosis Date  . Anxiety   . Enlarged thyroid     Past Surgical History: Past Surgical History:  Procedure Laterality Date  . NO PAST SURGERIES     OB History:  OB History  Gravida Para Term Preterm AB Living  2 1 1     1   SAB TAB Ectopic Multiple Live Births          1    # Outcome Date GA Lbr Len/2nd Weight Sex Delivery Anes PTL Lv  2 Current           1 Term 2008 [redacted]w[redacted]d  7 lb 3.2 oz (3.266 kg) M Vag-Spont   LIV      Family History: Family History  Problem Relation Age of Onset  . Diabetes Sister   . Graves' disease Father   . Breast cancer Neg Hx   . Ovarian cancer Neg Hx   . Colon cancer Neg Hx     Social History: Social History   Social History  . Marital status: Single    Spouse name: N/A  . Number of children: N/A  . Years of education: N/A   Occupational History  . Not on file.   Social History Main Topics  . Smoking status: Former Smoker    Types: Cigars  . Smokeless tobacco: Never Used  . Alcohol use No  . Drug use: No  . Sexual activity: Yes    Birth control/ protection: None   Other Topics Concern  . Not on file   Social History Narrative  . No narrative on file    Allergies: No Known Allergies  Current Medications: Current Outpatient Prescriptions  Medication Sig Dispense Refill  . folic acid (FOLVITE) 1 MG tablet Take by mouth.     No current facility-administered medications for this visit.    Review of Systems Per interval history  Objective:  Physical Examination:  BP (!) 146/83 (BP Location: Left Arm, Patient Position: Sitting)   Pulse 79   Temp 97.8 F (36.6 C) (Tympanic)   Resp 18   Wt 240 lb (108.9 kg)   LMP 02/07/2017   BMI 36.49 kg/m    ECOG Performance Status: 0 - Asymptomatic  General appearance: alert, cooperative and appears stated age HEENT:PERRLA, neck supple with midline trachea and thyroid without  masses Lymph node survey: non-palpable, axillary, inguinal, supraclavicular Cardiovascular: regular rate and rhythm, no murmurs or gallops Respiratory: normal air entry, lungs clear to auscultation and no rales, rhonchi or wheezing Breast exam: not examined. Abdomen: soft, non-tender, without masses or organomegaly, no hernias and well healed incision Back: inspection of back is normal Extremities: extremities normal, atraumatic, no cyanosis or edema Skin exam - normal coloration and turgor, no rashes, no suspicious skin lesions noted. Neurological exam reveals alert, oriented, normal speech, no focal findings or movement disorder noted.  Pelvic 04/16/17): exam chaperoned by nurse;  Vulva: normal appearing vulva with no masses, tenderness or lesions; Vagina: normal vagina; Adnexa: normal adnexa in size, nontender and no masses; Uterus: uterus is normal size, shape, consistency and nontender; Cervix: anteverted; Rectal: normal rectal, no masses  CA125 = 23, LDH/AFP normal    Assessment:  Joanne Shannon is a 31 y.o. G2P1 female diagnosed with 7 cm solid left adnexal mass at 9 weeks of desired pregnancy.  Most likely this solid mass is a benign, fibroma, endometrioma, dermoid, myoma or corpus luteum.  However, malignancy cannot be ruled out and this could be a sex cord stromal tumor or germ cell tumor, but less likely epithelial ovarian cancer at her age.  The mass did not appear to grow over the course of three ultrasounds done from 8/14 to 8/30 and repeat US today at 13 weeks is unchanged and she has minimal symptoms and tumor markers were negative.    Plan:   Problem List Items Addressed This Visit    None    Visit Diagnoses    Pelvic mass    -  Primary   Relevant Orders   US Pelvis Complete   US PELVIS TRANSVANGINAL NON-OB (TV ONLY)     We again discussed options for management including surveillance versus surgical intervention.  Most masses in pregnancy are benign, but there is a  risk of ovarian torsion or pain and obstruction of labor if the mass got a lot larger.  Presently she does not have significant symptoms other than mild intermittent LLQ discomfort.  The mass is small and not growing presently and is unlikely to affect the pregnancy.  Normal tumor markers, small size and lack of growth reduce concern for malignancy.  We discussed the pros and cons of surgery vs expectant management and I favor the latter due to stability of the mass. Suggested return to clinic in  2 weeks for repeat US to again assess stability.  If mass does not enlarge it would be reasonable to follow with [place] through pregnancy and then remove it later.  This could be done at the time of C section if this occurs or 6 weeks post partum.  I will also discuss the case with my partners again and perhaps it may be worthwhile to do an MRI abd/pelvis to better characterize the mass and look for adenopathy or other evidence of metastatic disease.   The patient's diagnosis, an outline of the further diagnostic and laboratory studies which will be required, the recommendation, and alternatives were discussed.  All questions were answered to the patient's satisfaction.   Mellody Drown, MD  CC:  Shari Prows, [facility] [address] [place], [place] [address] [phone]

## 2017-05-10 NOTE — Progress Notes (Signed)
I have reviewed the record and concur with patient management and plan.  Kylor Valverde, MD [facility]     

## 2017-05-14 ENCOUNTER — Telehealth: Payer: Self-pay

## 2017-05-14 DIAGNOSIS — N9489 Other specified conditions associated with female genital organs and menstrual cycle: Secondary | ICD-10-CM

## 2017-05-14 NOTE — Telephone Encounter (Signed)
  Oncology Nurse Navigator Documentation Per. Dr. Fransisca Connors, order MRI instead of U/S for next follow up. Spoke with MRI Jenny Reichmann) regarding safety with pregnancy. MRI should be without contrast. Order placed and Ms. Hixon was notified. Navigator Location: [facility] (05/14/17 1200)   )Navigator Encounter Type: Telephone (05/14/17 1200)                                                    Time Spent with Patient: 15 (05/14/17 1200)

## 2017-05-20 ENCOUNTER — Ambulatory Visit: Payer: 59

## 2017-05-20 ENCOUNTER — Ambulatory Visit
Admission: RE | Admit: 2017-05-20 | Discharge: 2017-05-20 | Disposition: A | Discharge status: Home / Self Care | Payer: 59 | Source: Ambulatory Visit | Attending: Obstetrics and Gynecology | Admitting: Obstetrics and Gynecology

## 2017-05-20 DIAGNOSIS — D271 Benign neoplasm of left ovary: Secondary | ICD-10-CM | POA: Insufficient documentation

## 2017-05-20 DIAGNOSIS — Z3A00 Weeks of gestation of pregnancy not specified: Secondary | ICD-10-CM | POA: Insufficient documentation

## 2017-05-20 DIAGNOSIS — N949 Unspecified condition associated with female genital organs and menstrual cycle: Secondary | ICD-10-CM | POA: Diagnosis present

## 2017-05-20 DIAGNOSIS — O3481 Maternal care for other abnormalities of pelvic organs, first trimester: Secondary | ICD-10-CM | POA: Insufficient documentation

## 2017-05-20 DIAGNOSIS — N9489 Other specified conditions associated with female genital organs and menstrual cycle: Secondary | ICD-10-CM

## 2017-05-21 ENCOUNTER — Inpatient Hospital Stay (HOSPITAL_BASED_OUTPATIENT_CLINIC_OR_DEPARTMENT_OTHER): Payer: 59 | Admitting: Obstetrics and Gynecology

## 2017-05-21 VITALS — BP 135/82 | HR 90 | Temp 98.5°F | Ht 68.0 in | Wt 239.7 lb

## 2017-05-21 DIAGNOSIS — O09891 Supervision of other high risk pregnancies, first trimester: Secondary | ICD-10-CM

## 2017-05-21 DIAGNOSIS — N9489 Other specified conditions associated with female genital organs and menstrual cycle: Secondary | ICD-10-CM

## 2017-05-21 DIAGNOSIS — D271 Benign neoplasm of left ovary: Secondary | ICD-10-CM | POA: Diagnosis not present

## 2017-05-21 NOTE — Progress Notes (Signed)
Patient here for follow up. No changes since her last appointment. 

## 2017-05-21 NOTE — Progress Notes (Signed)
Gynecologic Oncology Consult Visit   Referring Provider: Dr DeFrancesco  Chief Concern: Pelvic mass in pregnancy  Subjective:  Joanne Shannon is a 31 y.o. female with desired pregnancy who is seen in consultation for Pelvic mass in first trimester of pregnancy.  [place] two weeks ago was stable, but we decided to have her come back today for MRI to better characterize the mass and make a final decision about surgery now versus at C section or postpartum.  MRI shows fat in the mass cw dermoid tumor.  No increase in size since prior scans.   MRI 05/20/17 Uterus: Single intrauterine fetus is seen in breech presentation. Focal myometrial contraction incidentally noted in lower uterine segment. Anterior placenta, without evidence of previa. Cervix is long and closed.  Right ovary: Appears normal.  No mass identified.  Left ovary: A complex left ovarian mass is seen which contains foci of internal fat. This measures 7.7 x 5.7 x 7.2 cm, and is consistent with a left ovarian dermoid.  She is not having a lot of pelvic pain, just expected nausea.  History Seen in [facility] emergency room referral for threatened abortion with elevated quantitative hCG in 03/18/2017 (9,048). US IMPRESSION: 1. Probable early intrauterine gestational sac with a yolk sac, but no fetal pole or cardiac activity yet visualized. Recommend follow-up quantitative B-HCG levels and follow-up US in 10-14 days to assess viability. This recommendation follows SRU consensus guidelines: Diagnostic Criteria for Nonviable Pregnancy Early in the First Trimester. [employer] 2013; 616:0737-10. 2. Round heterogeneous 7 cm echogenic structure in the left adnexa, with nonvisualization of left ovary. Differential considerations include left ovarian mass versus non peristalsing bowel. This can be re-assessed on follow-up ultrasound.  Seen by Dr Enzo Bi 03/21/17. No further bleeding at that time Ultrasound 03/18/2017:   IMPRESSION: 1. Probable early intrauterine gestational sac with a yolk sac, but no fetal pole or cardiac activity yet visualized. Recommend follow-up quantitative B-HCG levels and follow-up US in 10-14 days to assess viability. This recommendation follows SRU consensus guidelines: Diagnostic Criteria for Nonviable Pregnancy Early in the First Trimester. [employer] 2013; 626:9485-46. 2. Round heterogeneous 7 cm echogenic structure in the left adnexa, with nonvisualization of left ovary. Differential considerations include left ovarian mass versus non peristalsing bowel. This can be re-assessed on follow-up ultrasound.  Had another ultrasound done. ULTRASOUND REPORT Location: [facility] Date of Service: 04/03/17 Indications:Dating and viabilty Findings: Singleton intrauterine pregnancy is visualized with a CRL consistent with 7 4/[redacted] weeks gestation, giving an (U/S) EDD of 11/16/17. The (U/S) EDD is consistent with the clinically established (LMP) EDD of 11/14/17. FHR: 155 CRL measurement: 13.4 mm Yolk sac and and early anatomy is normal. Right Ovary measures 3.3 x 2.7 x 2.3 cm. It is normal in appearance. Left Ovary measures 8.4 x 7.9 x 6.3 cm. There Is a hyperechoic mass in the left adnexa possibly ovarian in origin. Mass measures 7.3 x 6.2 x 6 cm. There is evidence of a corpus luteal cyst in the Right There is no free peritoneal fluid in the cul de sac.  Impression: 1. 7 4/7 week Viable Singleton Intrauterine pregnancy by U/S. 2. (U/S) EDD is consistent with Clinically established (LMP) EDD of 11/16/17. 3. There Is a complex hyperechoic mass in the left adnexa possibly ovarian in origin. Mass measures 7.3 x 6.2 x 6 cm.  Recommendations: 1.Clinical correlation with the patient's History and Physical Exam. 2. Consider GYN oncology referral for further management recommendations.  Saw Dr  DeFrancesco 04/10/17. Recommend GYN oncology referral for consultation regarding  management during pregnancy. I suspect serial ultrasounds will be encouraged to follow the mass throughout pregnancy with more definitive follow-up after completion of pregnancy. We will follow GYN oncology recommendations for surgical surgical intervention sooner if deemed necessary.   [place] 10/3 showed no change in size (6.7 x 5.0 x 6.4 cm) of primarily hyperechoic left ovarian mass.  CA125, AFP, LDH were normal two weeks ago.    Problem List: Patient Active Problem List   Diagnosis Date Noted  . Goiter 05/03/2017  . Obesity (BMI 30-39.9) 05/03/2017  . Threatened abortion 03/21/2017  . Adnexal mass 03/21/2017    Past Medical History: Past Medical History:  Diagnosis Date  . Anxiety   . Enlarged thyroid     Past Surgical History: Past Surgical History:  Procedure Laterality Date  . NO PAST SURGERIES     OB History:  OB History  Gravida Para Term Preterm AB Living  2 1 1     1   SAB TAB Ectopic Multiple Live Births          1    # Outcome Date GA Lbr Len/2nd Weight Sex Delivery Anes PTL Lv  2 Current           1 Term 2008 [redacted]w[redacted]d  7 lb 3.2 oz (3.266 kg) M Vag-Spont   LIV      Family History: Family History  Problem Relation Age of Onset  . Diabetes Sister   . Graves' disease Father   . Breast cancer Neg Hx   . Ovarian cancer Neg Hx   . Colon cancer Neg Hx     Social History: Social History   Social History  . Marital status: Single    Spouse name: N/A  . Number of children: N/A  . Years of education: N/A   Occupational History  . Not on file.   Social History Main Topics  . Smoking status: Former Smoker    Types: Cigars  . Smokeless tobacco: Never Used  . Alcohol use No  . Drug use: No  . Sexual activity: Yes    Birth control/ protection: None   Other Topics Concern  . Not on file   Social History Narrative  . No narrative on file    Allergies: No Known Allergies  Current Medications: Current Outpatient Prescriptions  Medication Sig Dispense  Refill  . Cetirizine HCl (ZYRTEC PO) Take by mouth.    . folic acid (FOLVITE) 1 MG tablet Take by mouth.    . Polyvinyl Alcohol-Povidone (CLEAR EYES ALL SEASONS) 5-6 MG/ML SOLN Apply to eye.     No current facility-administered medications for this visit.    Review of Systems Per interval history  Objective:  Physical Examination:  BP 135/82 (BP Location: Right Wrist, Patient Position: Sitting)   Pulse 90   Temp 98.5 F (36.9 C) (Tympanic)   Ht 5\' 8"  (1.727 m)   Wt 239 lb 11.2 oz (108.7 kg)   LMP 02/07/2017   BMI 36.45 kg/m    ECOG Performance Status: 0 - Asymptomatic Exam per two weeks ago. General appearance: alert, cooperative and appears stated age HEENT:PERRLA, neck supple with midline trachea and thyroid without masses Lymph node survey: non-palpable, axillary, inguinal, supraclavicular Cardiovascular: regular rate and rhythm, no murmurs or gallops Respiratory: normal air entry, lungs clear to auscultation and no rales, rhonchi or wheezing Breast exam: not examined. Abdomen: soft, non-tender, without masses or organomegaly, no hernias and well healed  incision Back: inspection of back is normal Extremities: extremities normal, atraumatic, no cyanosis or edema Skin exam - normal coloration and turgor, no rashes, no suspicious skin lesions noted. Neurological exam reveals alert, oriented, normal speech, no focal findings or movement disorder noted.  Pelvic 04/16/17): exam chaperoned by nurse;  Vulva: normal appearing vulva with no masses, tenderness or lesions; Vagina: normal vagina; Adnexa: normal adnexa in size, nontender and no masses; Uterus: uterus is normal size, shape, consistency and nontender; Cervix: anteverted; Rectal: normal rectal, no masses  CA125 = 23, LDH/AFP normal    Assessment:  Joanne Shannon is a 31 y.o. G2P1 female diagnosed with 7 cm solid left adnexal mass at 9 weeks of desired pregnancy. Now close to 15 weeks.  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  Plan:   Problem List Items Addressed This Visit      Other   Adnexal mass - Primary     We again discussed options for management of her adnexal mass.  In view of lack of growth and MRI cw dermoid recommend that this mass is removed at the time of delivery if she has a C-section for obstetric indications.  If she has a vaginal delivery, which is likely since she has a prior vaginal delivery, the mass can be removed laparoscopically a month or two after delivery.  Discussed that the risk of cancer in a dermoid that is not growing is less than 1%.  She will follow up with [facility] for further care, or we can see her back if they would prefer that we do the surgery postpartum.    The patient's diagnosis, an outline of the further diagnostic and laboratory studies which will be required, the recommendation, and alternatives were discussed.  All questions were answered to the patient's satisfaction.   Mellody Drown, MD  CC:  Shari Prows, [facility] [place], [place] [address] [phone]

## 2017-05-28 ENCOUNTER — Encounter: Payer: Self-pay | Admitting: Obstetrics and Gynecology

## 2017-05-28 ENCOUNTER — Ambulatory Visit (INDEPENDENT_AMBULATORY_CARE_PROVIDER_SITE_OTHER): Payer: Medicaid Other | Admitting: Obstetrics and Gynecology

## 2017-05-28 VITALS — BP 115/72 | HR 87 | Wt 236.3 lb

## 2017-05-28 DIAGNOSIS — E669 Obesity, unspecified: Secondary | ICD-10-CM

## 2017-05-28 DIAGNOSIS — N949 Unspecified condition associated with female genital organs and menstrual cycle: Secondary | ICD-10-CM

## 2017-05-28 DIAGNOSIS — N9489 Other specified conditions associated with female genital organs and menstrual cycle: Secondary | ICD-10-CM

## 2017-05-28 DIAGNOSIS — Z3482 Encounter for supervision of other normal pregnancy, second trimester: Secondary | ICD-10-CM

## 2017-05-28 DIAGNOSIS — D573 Sickle-cell trait: Secondary | ICD-10-CM

## 2017-05-28 LAB — POCT URINALYSIS DIPSTICK
Bilirubin, UA: NEGATIVE
GLUCOSE UA: NEGATIVE
Ketones, UA: NEGATIVE
LEUKOCYTES UA: NEGATIVE
NITRITE UA: NEGATIVE
PH UA: 6 (ref 5.0–8.0)
Protein, UA: NEGATIVE
RBC UA: NEGATIVE
Spec Grav, UA: 1.01 (ref 1.010–1.025)
UROBILINOGEN UA: 0.2 U/dL

## 2017-05-28 MED ORDER — CITRANATAL BLOOM 90-1 MG PO TABS: 1.0000 | ORAL_TABLET | Freq: Every day | ORAL | 6 refills | Status: DC

## 2017-05-28 NOTE — Progress Notes (Signed)
ROB: Ovarian cyst is diagnosed as dermoid by MRI.  Recommend removal postpartum.  Patient to have AFP and 1 hour GCT at 18 weeks.  Ultrasound FAS at 19 weeks.  Weight loss discussed, patient eating without problem.

## 2017-05-28 NOTE — Addendum Note (Signed)
Addended by: Raliegh Ip on: 05/28/2017 12:20 PM   Modules accepted: Orders

## 2017-06-10 ENCOUNTER — Other Ambulatory Visit: Payer: Self-pay | Admitting: Obstetrics and Gynecology

## 2017-06-10 DIAGNOSIS — Z369 Encounter for antenatal screening, unspecified: Secondary | ICD-10-CM

## 2017-06-17 ENCOUNTER — Ambulatory Visit: Payer: Medicaid Other

## 2017-06-17 DIAGNOSIS — Z833 Family history of diabetes mellitus: Secondary | ICD-10-CM

## 2017-06-17 DIAGNOSIS — Z3483 Encounter for supervision of other normal pregnancy, third trimester: Secondary | ICD-10-CM

## 2017-06-17 DIAGNOSIS — E669 Obesity, unspecified: Secondary | ICD-10-CM

## 2017-06-17 DIAGNOSIS — Z369 Encounter for antenatal screening, unspecified: Secondary | ICD-10-CM

## 2017-06-18 LAB — GLUCOSE, 1 HOUR GESTATIONAL: Gestational Diabetes Screen: 117 mg/dL (ref 65–139)

## 2017-07-01 ENCOUNTER — Encounter: Payer: Self-pay | Admitting: Obstetrics and Gynecology

## 2017-07-01 ENCOUNTER — Ambulatory Visit (INDEPENDENT_AMBULATORY_CARE_PROVIDER_SITE_OTHER): Payer: Medicaid Other | Admitting: Obstetrics and Gynecology

## 2017-07-01 ENCOUNTER — Ambulatory Visit (INDEPENDENT_AMBULATORY_CARE_PROVIDER_SITE_OTHER): Payer: 59

## 2017-07-01 VITALS — BP 117/72 | HR 92 | Wt 238.4 lb

## 2017-07-01 DIAGNOSIS — E049 Nontoxic goiter, unspecified: Secondary | ICD-10-CM

## 2017-07-01 DIAGNOSIS — Z369 Encounter for antenatal screening, unspecified: Secondary | ICD-10-CM | POA: Diagnosis not present

## 2017-07-01 DIAGNOSIS — N949 Unspecified condition associated with female genital organs and menstrual cycle: Secondary | ICD-10-CM

## 2017-07-01 DIAGNOSIS — N9489 Other specified conditions associated with female genital organs and menstrual cycle: Secondary | ICD-10-CM

## 2017-07-01 DIAGNOSIS — Z3482 Encounter for supervision of other normal pregnancy, second trimester: Secondary | ICD-10-CM | POA: Diagnosis not present

## 2017-07-01 LAB — POCT URINALYSIS DIPSTICK
BILIRUBIN UA: NEGATIVE
Glucose, UA: NEGATIVE
KETONES UA: NEGATIVE
NITRITE UA: NEGATIVE
PH UA: 6 (ref 5.0–8.0)
Protein, UA: NEGATIVE
RBC UA: NEGATIVE
Spec Grav, UA: 1.025 (ref 1.010–1.025)
Urobilinogen, UA: 0.2 E.U./dL

## 2017-07-01 NOTE — Progress Notes (Signed)
ROB: Doing well, no complaints.  Concerned with lack of weight gain. Discussed that she is now gaining some weight, but encouraged to increase protein, can also add supplements (Boost or Ensure), or switch from 1% milk to whole milk.  Normal anatomy scan today.  Right ovarian cyst now 6 cm (previously 7 cm). Will repeat thyroid hormone levels this trimester for goiter.

## 2017-07-01 NOTE — Progress Notes (Signed)
ROB- Pt has u/s today, pt states she is doing ok, no complaints today

## 2017-07-02 LAB — THYROID PANEL WITH TSH
Free Thyroxine Index: 1.3 (ref 1.2–4.9)
T3 Uptake Ratio: 15 % — ABNORMAL LOW (ref 24–39)
T4 TOTAL: 8.9 ug/dL (ref 4.5–12.0)
TSH: 1.05 u[IU]/mL (ref 0.450–4.500)

## 2017-07-08 ENCOUNTER — Encounter: Payer: Self-pay | Admitting: Obstetrics and Gynecology

## 2017-07-31 ENCOUNTER — Ambulatory Visit (INDEPENDENT_AMBULATORY_CARE_PROVIDER_SITE_OTHER): Payer: 59 | Admitting: Obstetrics and Gynecology

## 2017-07-31 ENCOUNTER — Encounter: Payer: Self-pay | Admitting: Obstetrics and Gynecology

## 2017-07-31 VITALS — BP 110/70 | HR 93 | Wt 240.4 lb

## 2017-07-31 DIAGNOSIS — B3731 Acute candidiasis of vulva and vagina: Secondary | ICD-10-CM

## 2017-07-31 DIAGNOSIS — Z3482 Encounter for supervision of other normal pregnancy, second trimester: Secondary | ICD-10-CM | POA: Diagnosis not present

## 2017-07-31 DIAGNOSIS — B373 Candidiasis of vulva and vagina: Secondary | ICD-10-CM

## 2017-07-31 LAB — POCT URINALYSIS DIPSTICK
Bilirubin, UA: NEGATIVE
Glucose, UA: NEGATIVE
Ketones, UA: NEGATIVE
Leukocytes, UA: NEGATIVE
NITRITE UA: NEGATIVE
PH UA: 5 (ref 5.0–8.0)
PROTEIN UA: NEGATIVE
RBC UA: NEGATIVE
Spec Grav, UA: 1.02 (ref 1.010–1.025)
UROBILINOGEN UA: 0.2 U/dL

## 2017-07-31 NOTE — Progress Notes (Signed)
ROB: Patient complains of vaginal discharge with itching.  She says she tried Monistat "on the outside"but still has the symptoms.  Is also requesting STD testing. (GC/CT performed). WET PREP: clue cells: absent, KOH (yeast): positive, odor: absent and trichomoniasis: negative Ph:  < 4.5 Advised intravaginal treatment using Monistat. 1 hour GCT next visit.   Recommend growth scan and follow-up of ovarian cyst approximately 30-32-week ultrasound.  Schedule with next visit.

## 2017-07-31 NOTE — Addendum Note (Signed)
Addended by: Raliegh Ip on: 07/31/2017 11:55 AM   Modules accepted: Orders

## 2017-08-02 LAB — GC/CHLAMYDIA PROBE AMP
Chlamydia trachomatis, NAA: NEGATIVE
NEISSERIA GONORRHOEAE BY PCR: NEGATIVE

## 2017-08-05 NOTE — L&D Delivery Note (Signed)
       Delivery Note   Joanne Shannon is a 32 y.o. G2P1001 at [redacted]w[redacted]d Estimated Date of Delivery: 11/14/17  PRE-OPERATIVE DIAGNOSIS:  1) [redacted]w[redacted]d pregnancy.   POST-OPERATIVE DIAGNOSIS:  1) [redacted]w[redacted]d pregnancy s/p Vaginal, Spontaneous   Delivery Type: Vaginal, Spontaneous    Delivery Anesthesia: None   Labor Complications:       ESTIMATED BLOOD LOSS: 50  ml    FINDINGS:   1) female infant, Apgar scores of 8    at 1 minute and 9    at 5 minutes and a birthweight of 123.1  ounces.    2) Nuchal cord: No  SPECIMENS:   PLACENTA:   Appearance:      Removal:        Disposition:     DISPOSITION:  Infant to left in stable condition in the delivery room, with L&D personnel and mother,  NARRATIVE SUMMARY: Labor course:  [name] is a G2P1001 at [redacted]w[redacted]d who presented for labor management.  She progressed well in labor without pitocin.  She received the appropriate anesthesia and proceeded to complete dilation after AROM. She evidenced good maternal expulsive effort during the second stage. She went on to deliver a viable infant. The placenta delivered without problems and was noted to be complete. A perineal and vaginal examination was performed. Episiotomy/Lacerations: 1st degree  Episiotomy or lacerations were repaired with Vicryl suture. The patient tolerated this well.  Finis Bud, M.D. 11/21/2017 5:48 AM

## 2017-08-21 ENCOUNTER — Ambulatory Visit (INDEPENDENT_AMBULATORY_CARE_PROVIDER_SITE_OTHER): Payer: Managed Care, Other (non HMO) | Admitting: Obstetrics and Gynecology

## 2017-08-21 ENCOUNTER — Encounter: Payer: Self-pay | Admitting: Obstetrics and Gynecology

## 2017-08-21 ENCOUNTER — Other Ambulatory Visit: Payer: Managed Care, Other (non HMO)

## 2017-08-21 VITALS — BP 119/77 | HR 106 | Wt 239.3 lb

## 2017-08-21 DIAGNOSIS — Z3482 Encounter for supervision of other normal pregnancy, second trimester: Secondary | ICD-10-CM | POA: Diagnosis not present

## 2017-08-21 DIAGNOSIS — O3483 Maternal care for other abnormalities of pelvic organs, third trimester: Secondary | ICD-10-CM

## 2017-08-21 DIAGNOSIS — E049 Nontoxic goiter, unspecified: Secondary | ICD-10-CM

## 2017-08-21 DIAGNOSIS — N83209 Unspecified ovarian cyst, unspecified side: Secondary | ICD-10-CM

## 2017-08-21 DIAGNOSIS — Z23 Encounter for immunization: Secondary | ICD-10-CM | POA: Diagnosis not present

## 2017-08-21 DIAGNOSIS — O2613 Low weight gain in pregnancy, third trimester: Secondary | ICD-10-CM

## 2017-08-21 DIAGNOSIS — Z113 Encounter for screening for infections with a predominantly sexual mode of transmission: Secondary | ICD-10-CM

## 2017-08-21 LAB — POCT URINALYSIS DIPSTICK
Bilirubin, UA: NEGATIVE
Glucose, UA: NEGATIVE
Nitrite, UA: NEGATIVE
Protein, UA: NEGATIVE
Spec Grav, UA: 1.02 (ref 1.010–1.025)
Urobilinogen, UA: 0.2 E.U./dL
pH, UA: 6 (ref 5.0–8.0)

## 2017-08-21 NOTE — Progress Notes (Signed)
ROB: Patient notes feeling some pressure, discussed pregnancy girdle. For 28 week labs today.  Desires to bottle feed, also discussed benefits of breastfeeding. Patient will consider. Desires unsure method for contraception. For Tdap today, signed blood consent, discussed cord blood banking.   Poor weight gain noted in pregnancy.  Will f/u growth in addition to large left adnexal cyst at 30-32 weeks. RTC in 2 weeks.

## 2017-08-21 NOTE — Progress Notes (Signed)
ROB- PT states she is doing well, feels some pressure at times

## 2017-08-21 NOTE — Patient Instructions (Addendum)

## 2017-08-22 LAB — CBC
HEMATOCRIT: 32.7 % — AB (ref 34.0–46.6)
Hemoglobin: 11.1 g/dL (ref 11.1–15.9)
MCH: 26.4 pg — AB (ref 26.6–33.0)
MCHC: 33.9 g/dL (ref 31.5–35.7)
MCV: 78 fL — AB (ref 79–97)
PLATELETS: 304 10*3/uL (ref 150–379)
RBC: 4.2 x10E6/uL (ref 3.77–5.28)
RDW: 13.2 % (ref 12.3–15.4)
WBC: 7.8 10*3/uL (ref 3.4–10.8)

## 2017-08-22 LAB — THYROID PANEL WITH TSH
FREE THYROXINE INDEX: 1.2 (ref 1.2–4.9)
T3 Uptake Ratio: 12 % — ABNORMAL LOW (ref 24–39)
T4 TOTAL: 9.9 ug/dL (ref 4.5–12.0)
TSH: 0.966 u[IU]/mL (ref 0.450–4.500)

## 2017-08-22 LAB — GLUCOSE, 1 HOUR GESTATIONAL: Gestational Diabetes Screen: 113 mg/dL (ref 65–139)

## 2017-08-22 LAB — RPR: RPR Ser Ql: NONREACTIVE

## 2017-08-25 ENCOUNTER — Encounter: Payer: Self-pay | Admitting: Obstetrics and Gynecology

## 2017-09-04 ENCOUNTER — Encounter: Payer: Self-pay | Admitting: Obstetrics and Gynecology

## 2017-09-04 ENCOUNTER — Ambulatory Visit (INDEPENDENT_AMBULATORY_CARE_PROVIDER_SITE_OTHER): Payer: Managed Care, Other (non HMO) | Admitting: Obstetrics and Gynecology

## 2017-09-04 VITALS — BP 130/77 | HR 102 | Wt 241.3 lb

## 2017-09-04 DIAGNOSIS — Z3483 Encounter for supervision of other normal pregnancy, third trimester: Secondary | ICD-10-CM | POA: Diagnosis not present

## 2017-09-04 DIAGNOSIS — N83209 Unspecified ovarian cyst, unspecified side: Secondary | ICD-10-CM

## 2017-09-04 DIAGNOSIS — O3483 Maternal care for other abnormalities of pelvic organs, third trimester: Secondary | ICD-10-CM

## 2017-09-04 LAB — POCT URINALYSIS DIPSTICK

## 2017-09-04 NOTE — Progress Notes (Signed)
ROB- Patient feels well today with no complaints.

## 2017-09-04 NOTE — Progress Notes (Signed)
ROB: Patient denies problems.  Ultrasound already scheduled for next visit.

## 2017-09-18 ENCOUNTER — Ambulatory Visit (INDEPENDENT_AMBULATORY_CARE_PROVIDER_SITE_OTHER): Payer: Managed Care, Other (non HMO) | Admitting: Obstetrics and Gynecology

## 2017-09-18 ENCOUNTER — Other Ambulatory Visit: Payer: Managed Care, Other (non HMO)

## 2017-09-18 ENCOUNTER — Ambulatory Visit (INDEPENDENT_AMBULATORY_CARE_PROVIDER_SITE_OTHER): Payer: Managed Care, Other (non HMO)

## 2017-09-18 VITALS — BP 114/68 | HR 93 | Wt 245.0 lb

## 2017-09-18 DIAGNOSIS — N83202 Unspecified ovarian cyst, left side: Secondary | ICD-10-CM

## 2017-09-18 DIAGNOSIS — Z3483 Encounter for supervision of other normal pregnancy, third trimester: Secondary | ICD-10-CM

## 2017-09-18 LAB — POCT URINALYSIS DIPSTICK
BILIRUBIN UA: NEGATIVE
Glucose, UA: NEGATIVE
Ketones, UA: NEGATIVE
NITRITE UA: NEGATIVE
PH UA: 6 (ref 5.0–8.0)
Protein, UA: NEGATIVE
RBC UA: NEGATIVE
Spec Grav, UA: 1.02 (ref 1.010–1.025)
UROBILINOGEN UA: 0.2 U/dL

## 2017-09-18 NOTE — Progress Notes (Signed)
ROB- pt is doing well 

## 2017-09-18 NOTE — Progress Notes (Signed)
ROB: Doing well, no complaints. Normal growth scan, did not comment on size of left ovarian cyst (last was 6 cm on 11/27). Patient still without complaints.  RTC in 2 weeks.

## 2017-10-02 ENCOUNTER — Encounter: Payer: Self-pay | Admitting: Obstetrics and Gynecology

## 2017-10-02 ENCOUNTER — Ambulatory Visit (INDEPENDENT_AMBULATORY_CARE_PROVIDER_SITE_OTHER): Payer: Managed Care, Other (non HMO) | Admitting: Obstetrics and Gynecology

## 2017-10-02 VITALS — BP 112/70 | HR 99 | Wt 245.4 lb

## 2017-10-02 DIAGNOSIS — Z3493 Encounter for supervision of normal pregnancy, unspecified, third trimester: Secondary | ICD-10-CM

## 2017-10-02 DIAGNOSIS — O3483 Maternal care for other abnormalities of pelvic organs, third trimester: Secondary | ICD-10-CM

## 2017-10-02 DIAGNOSIS — N83209 Unspecified ovarian cyst, unspecified side: Secondary | ICD-10-CM

## 2017-10-02 NOTE — Progress Notes (Signed)
ROB- pt is doing well 

## 2017-10-02 NOTE — Progress Notes (Signed)
ROB: Patient without complaints.  Reports active fetal movement.

## 2017-10-15 ENCOUNTER — Ambulatory Visit (INDEPENDENT_AMBULATORY_CARE_PROVIDER_SITE_OTHER): Payer: Managed Care, Other (non HMO) | Admitting: Obstetrics and Gynecology

## 2017-10-15 VITALS — BP 129/79 | HR 90 | Wt 249.7 lb

## 2017-10-15 DIAGNOSIS — Z3685 Encounter for antenatal screening for Streptococcus B: Secondary | ICD-10-CM

## 2017-10-15 DIAGNOSIS — Z113 Encounter for screening for infections with a predominantly sexual mode of transmission: Secondary | ICD-10-CM

## 2017-10-15 DIAGNOSIS — Z3483 Encounter for supervision of other normal pregnancy, third trimester: Secondary | ICD-10-CM

## 2017-10-15 LAB — POCT URINALYSIS DIPSTICK
Bilirubin, UA: NEGATIVE
Blood, UA: NEGATIVE
Glucose, UA: NEGATIVE
KETONES UA: NEGATIVE
LEUKOCYTES UA: NEGATIVE
Nitrite, UA: NEGATIVE
PH UA: 6.5 (ref 5.0–8.0)
Protein, UA: NEGATIVE
Spec Grav, UA: 1.015 (ref 1.010–1.025)
UROBILINOGEN UA: 0.2 U/dL

## 2017-10-15 MED ORDER — DOCUSATE SODIUM 100 MG PO CAPS: 100.0000 mg | ORAL_CAPSULE | Freq: Two times a day (BID) | ORAL | 2 refills | Status: AC | PRN

## 2017-10-15 NOTE — Progress Notes (Signed)
[name] hard stool w /conspitation at time no other concerns.

## 2017-10-15 NOTE — Progress Notes (Signed)
ROB: Does note constipation, advised on increasing fluid intake, prune/apple juice, and prescribed Colace. 36 week labs done today. Discussed labor precautions. Plans for epidural in labor.  RTC in 1 week.

## 2017-10-17 LAB — GC/CHLAMYDIA PROBE AMP
CHLAMYDIA, DNA PROBE: NEGATIVE
NEISSERIA GONORRHOEAE BY PCR: NEGATIVE

## 2017-10-19 LAB — CULTURE, BETA STREP (GROUP B ONLY): Strep Gp B Culture: NEGATIVE

## 2017-10-22 ENCOUNTER — Ambulatory Visit (INDEPENDENT_AMBULATORY_CARE_PROVIDER_SITE_OTHER): Payer: Managed Care, Other (non HMO) | Admitting: Obstetrics and Gynecology

## 2017-10-22 ENCOUNTER — Encounter: Payer: Self-pay | Admitting: Obstetrics and Gynecology

## 2017-10-22 VITALS — BP 122/76 | HR 99 | Wt 251.2 lb

## 2017-10-22 DIAGNOSIS — Z3483 Encounter for supervision of other normal pregnancy, third trimester: Secondary | ICD-10-CM

## 2017-10-22 LAB — POCT URINALYSIS DIPSTICK
BILIRUBIN UA: NEGATIVE
GLUCOSE UA: NEGATIVE
Ketones, UA: NEGATIVE
Nitrite, UA: NEGATIVE
Odor: NEGATIVE
Protein, UA: NEGATIVE
RBC UA: NEGATIVE
SPEC GRAV UA: 1.015 (ref 1.010–1.025)
UROBILINOGEN UA: 0.2 U/dL
pH, UA: 6 (ref 5.0–8.0)

## 2017-10-22 NOTE — Progress Notes (Signed)
ROB- no complaints.

## 2017-10-22 NOTE — Progress Notes (Signed)
ROB: Patient without complaint.  Rare contractions.  Cultures done last week all negative.

## 2017-10-29 ENCOUNTER — Ambulatory Visit (INDEPENDENT_AMBULATORY_CARE_PROVIDER_SITE_OTHER): Payer: Managed Care, Other (non HMO) | Admitting: Obstetrics and Gynecology

## 2017-10-29 VITALS — BP 124/70 | HR 94 | Wt 255.3 lb

## 2017-10-29 DIAGNOSIS — O3483 Maternal care for other abnormalities of pelvic organs, third trimester: Secondary | ICD-10-CM

## 2017-10-29 DIAGNOSIS — N83209 Unspecified ovarian cyst, unspecified side: Secondary | ICD-10-CM

## 2017-10-29 DIAGNOSIS — Z3483 Encounter for supervision of other normal pregnancy, third trimester: Secondary | ICD-10-CM

## 2017-10-29 LAB — POCT URINALYSIS DIPSTICK
Bilirubin, UA: NEGATIVE
Blood, UA: NEGATIVE
Glucose, UA: NEGATIVE
Ketones, UA: NEGATIVE
Nitrite, UA: NEGATIVE
Protein, UA: NEGATIVE
Spec Grav, UA: 1.015
Urobilinogen, UA: 0.2 U/dL
pH, UA: 6.5

## 2017-10-29 NOTE — Progress Notes (Signed)
ROB: Doing well, no complaints. Discussed labor precautions. RTC in 1 week.

## 2017-10-29 NOTE — Progress Notes (Signed)
ROB- pt is doing well 

## 2017-11-05 ENCOUNTER — Encounter: Payer: Self-pay | Admitting: Obstetrics and Gynecology

## 2017-11-05 ENCOUNTER — Ambulatory Visit (INDEPENDENT_AMBULATORY_CARE_PROVIDER_SITE_OTHER): Payer: Managed Care, Other (non HMO) | Admitting: Obstetrics and Gynecology

## 2017-11-05 VITALS — BP 131/75 | HR 102 | Wt 256.0 lb

## 2017-11-05 DIAGNOSIS — Z3483 Encounter for supervision of other normal pregnancy, third trimester: Secondary | ICD-10-CM

## 2017-11-05 LAB — POCT URINALYSIS DIPSTICK
Bilirubin, UA: NEGATIVE
Blood, UA: NEGATIVE
Glucose, UA: NEGATIVE
KETONES UA: NEGATIVE
Leukocytes, UA: NEGATIVE
NITRITE UA: NEGATIVE
PH UA: 5 (ref 5.0–8.0)
PROTEIN UA: NEGATIVE
Spec Grav, UA: 1.01 (ref 1.010–1.025)
UROBILINOGEN UA: 0.2 U/dL

## 2017-11-05 NOTE — Progress Notes (Signed)
ROB: Occasional contractions.  Signs and symptoms reviewed.  No complaints.

## 2017-11-12 ENCOUNTER — Encounter: Payer: Self-pay | Admitting: Obstetrics and Gynecology

## 2017-11-12 ENCOUNTER — Ambulatory Visit (INDEPENDENT_AMBULATORY_CARE_PROVIDER_SITE_OTHER): Payer: Managed Care, Other (non HMO) | Admitting: Obstetrics and Gynecology

## 2017-11-12 VITALS — BP 131/75 | HR 93 | Wt 258.2 lb

## 2017-11-12 DIAGNOSIS — O480 Post-term pregnancy: Secondary | ICD-10-CM

## 2017-11-12 DIAGNOSIS — Z3483 Encounter for supervision of other normal pregnancy, third trimester: Secondary | ICD-10-CM

## 2017-11-12 DIAGNOSIS — Z130 Encounter for screening for diseases of the blood and blood-forming organs and certain disorders involving the immune mechanism: Secondary | ICD-10-CM

## 2017-11-12 LAB — POCT URINALYSIS DIPSTICK
BILIRUBIN UA: NEGATIVE
Blood, UA: NEGATIVE
GLUCOSE UA: NEGATIVE
Ketones, UA: NEGATIVE
Nitrite, UA: NEGATIVE
PH UA: 6 (ref 5.0–8.0)
Protein, UA: NEGATIVE
Spec Grav, UA: 1.01 (ref 1.010–1.025)
Urobilinogen, UA: 0.2 E.U./dL

## 2017-11-12 NOTE — Progress Notes (Signed)
[name] thinks her water broke this morning.

## 2017-11-12 NOTE — Progress Notes (Addendum)
ROB: patient doing well today  Thought she might hve been leaking fluid starting today. Exam with intact membranes.  Reiterated labor precautions. RTC in 1 week, for BPP and growth then. Will schedule for IOL by 41 weeks.

## 2017-11-13 ENCOUNTER — Other Ambulatory Visit: Payer: Self-pay | Admitting: Obstetrics and Gynecology

## 2017-11-13 ENCOUNTER — Encounter: Payer: Self-pay | Admitting: Obstetrics and Gynecology

## 2017-11-13 ENCOUNTER — Encounter (INDEPENDENT_AMBULATORY_CARE_PROVIDER_SITE_OTHER): Payer: Self-pay

## 2017-11-13 DIAGNOSIS — O480 Post-term pregnancy: Secondary | ICD-10-CM

## 2017-11-13 DIAGNOSIS — Z3483 Encounter for supervision of other normal pregnancy, third trimester: Secondary | ICD-10-CM

## 2017-11-13 NOTE — Patient Instructions (Signed)
  [facility]TH[facility] INDUCTION ASSESSMENT SCHEDULING Joanne Shannon 08/05/1986 Medical record #: [id number] Phone #:  Home Phone [phone]  Mobile [phone]    Prenatal Provider: Dr. Marcelline Mates - Delivering Group:[facility] Proposed admission date/time:11/21/2017 @ 12:00 a.m.  Method of induction:Cytotec  Weight: Filed Weights04/10/19 1600Weight:258 lb 3.2 oz (117.1 kg) BMI Body mass index is 39.26 kg/m. HIV Negative HSV Negative EDC Estimated Date of Delivery: 11/14/17 based on:LMP  Gestational age on admission: [redacted]w[redacted]d Gravidity/parity:G2P1001  Cervix Score   0 1 2 3   Position Posterior Midposition Anterior   Consistency Firm Medium Soft   Effacement (%) 0-30 40-50 60-70 >80  Dilation (cm) Closed 1-2 3-4 >5  Baby's station -3 -2 -1 +1, +2   Bishop Score:1   Medical induction of labor  select indication(s) below Elective induction ?39 weeks multiparous patient ?39 weeks primiparous patient with Bishop score ?7 ?40 weeks primiparous patient   Medical Indications Adapted from [facility] #[id number], "Medically Indicated Late Preterm and Early Term Deliveries," 2013.  PLACENTAL / UTERINE ISSUES FETAL ISSUES MATERNAL ISSUES  ? Placenta previa (36.0-37.6) ? Isoimmunization (37.0-38.6) ? Preeclampsia without severe features or gestational HTN (37.0)  ? Suspected accreta (34.0-35.6) ? Growth Restriction [name] ? Preeclampsia with severe features (34.0)  ? Prior classical CD, uterine window, rupture (36.0-37.6) ? Isolated (38.0-39.6) ? Chronic HTN (38.0-39.6)  ? Prior myomectomy (37.0-38.6) ? Concurrent findings (34.0-37.6) ? Cholestasis (37.0)  ? 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 Growth Restriction (Twins) ? Diabetes  ? Placental abruption (chronic) ? Di-Di Isolated (36.0-37.6) ? Pregestational, controlled (39.0)  OBSTETRIC ISSUES ? Di-Di concurrent findings (32.0-34.6) ? Pregestational, uncontrolled (37.0-39.0)  ? Postdates ? (41 weeks) ? Mo-Di isolated  (32.0-34.6) ? Pregestational, vascular compromise (37.0- 39.0)  ? PPROM (34.0) ? Multiple Gestation ? Gestational, diet controlled (40.0)  ? Hx of IUFD (39.0 weeks) ? Di-Di (38.0-38.6) ? Gestational, med controlled (39.0)  ? Polyhydramnios, mild/moderate; SDV 8-16 or AFI 25-35 (39.0) ? Mo-Di (36.0-37.6) ? Gestational, uncontrolled (38.0-39.0)  ? Oligohydramnios (36.0-37.6); MVP <2 cm  For indications not listed above, delivery recommendations from maternal-fetal medicine consultant occurred on: Date: with Dr.     for indication of:  Provider Signature: Rubie Maid, MD Scheduled MV:[facility] Date:11/13/2017 9:21 AM   Call [phone] to finalize the induction date/time  [id number] (07/17)

## 2017-11-19 ENCOUNTER — Ambulatory Visit (INDEPENDENT_AMBULATORY_CARE_PROVIDER_SITE_OTHER): Payer: Managed Care, Other (non HMO)

## 2017-11-19 ENCOUNTER — Ambulatory Visit (INDEPENDENT_AMBULATORY_CARE_PROVIDER_SITE_OTHER): Payer: Managed Care, Other (non HMO) | Admitting: Obstetrics and Gynecology

## 2017-11-19 VITALS — BP 126/78 | HR 83 | Wt 258.2 lb

## 2017-11-19 DIAGNOSIS — O480 Post-term pregnancy: Secondary | ICD-10-CM | POA: Diagnosis not present

## 2017-11-19 DIAGNOSIS — Z3483 Encounter for supervision of other normal pregnancy, third trimester: Secondary | ICD-10-CM | POA: Diagnosis not present

## 2017-11-19 LAB — POCT URINALYSIS DIPSTICK
Bilirubin, UA: NEGATIVE
Blood, UA: NEGATIVE
GLUCOSE UA: NEGATIVE
Ketones, UA: NEGATIVE
Nitrite, UA: NEGATIVE
Protein, UA: NEGATIVE
Spec Grav, UA: 1.01 (ref 1.010–1.025)
Urobilinogen, UA: 0.2 E.U./dL
pH, UA: 6.5 (ref 5.0–8.0)

## 2017-11-19 NOTE — Progress Notes (Signed)
[name] is doing well no concerns.

## 2017-11-19 NOTE — Progress Notes (Signed)
ROB: Denies contractions.  Induction scheduled for Friday.  Discussed use of Cytotec and induction in detail.  Signs and symptoms of labor reviewed.  BPP today 8/8.  68%.

## 2017-11-20 ENCOUNTER — Observation Stay (HOSPITAL_BASED_OUTPATIENT_CLINIC_OR_DEPARTMENT_OTHER)
Admission: EM | Admit: 2017-11-20 | Discharge: 2017-11-20 | Disposition: A | Discharge status: Home / Self Care | Payer: Managed Care, Other (non HMO) | Source: Home / Self Care | Admitting: Obstetrics and Gynecology

## 2017-11-20 ENCOUNTER — Other Ambulatory Visit: Payer: Self-pay

## 2017-11-20 DIAGNOSIS — R109 Unspecified abdominal pain: Secondary | ICD-10-CM

## 2017-11-20 DIAGNOSIS — O26899 Other specified pregnancy related conditions, unspecified trimester: Secondary | ICD-10-CM

## 2017-11-20 DIAGNOSIS — Z3A40 40 weeks gestation of pregnancy: Secondary | ICD-10-CM | POA: Diagnosis not present

## 2017-11-20 DIAGNOSIS — O471 False labor at or after 37 completed weeks of gestation: Secondary | ICD-10-CM | POA: Insufficient documentation

## 2017-11-20 NOTE — Progress Notes (Signed)
Joanne Hailey, MD order to continue monitoring, feed regular diet, and recheck cervix after.

## 2017-11-20 NOTE — Discharge Summary (Signed)
    L&D OB Triage Note  SUBJECTIVE Joanne Shannon is a 32 y.o. G60P1001 female at [redacted]w[redacted]d, EDD Estimated Date of Delivery: 11/14/17 who presented to triage with complaints of contractions.   However since presenting to the hospital her contractions have spaced out to approximately every 10 minutes.  Patient is no longer uncomfortable.  OB History  Gravida Para Term Preterm AB Living  2 1 1  0 0 1  SAB TAB Ectopic Multiple Live Births  0 0 0 0 1    # Outcome Date GA Lbr Len/2nd Weight Sex Delivery Anes PTL Lv  2 Current           1 Term 2008 [redacted]w[redacted]d  7 lb 3.2 oz (3.266 kg) M Vag-Spont   LIV    No medications prior to admission.     OBJECTIVE  Nursing Evaluation:   BP 126/76 (BP Location: Right Arm)   Pulse 81   Temp 98.4 F (36.9 C) (Oral)   Resp 18   Ht 5\' 8"  (1.727 m)   Wt 258 lb (117 kg)   LMP 02/07/2017   BMI 39.23 kg/m    Findings:   Cervical exam changed from 2 cm to approximately 3-1/2 cm. (Nurse exam)  NST was performed and has been reviewed by me.  NST INTERPRETATION: Category I  Mode: External Baseline Rate (A): 140 bpm Variability: Moderate Accelerations: 15 x 15 Decelerations: None     Contraction Frequency (min): 9-10  ASSESSMENT Impression:  1.  Pregnancy:  G2P1001 at [redacted]w[redacted]d , EDD Estimated Date of Delivery: 11/14/17 2.  NST:  Category I  PLAN 1.  Because the patient is scheduled for induction tomorrow I advised that she should stay and we would begin her induction now.  She states that her preference would be to go home and come back for her induction.  She has social issues including vehicles and clothing for her hospital stay that she would like to arrange. Because her contractions have spaced out and she no longer seems in labor I have consented to this plan.  Because her cervix is favorable, I see no reason to admit for cervical ripening at midnight.  Will bring her in at 6 AM for AROM and Pitocin or Cytotec. 2. Discharge home with standard labor  precautions given to return to L&D for problems.

## 2017-11-20 NOTE — Progress Notes (Signed)
Provided and reviewed discharge paperwork. Pt verbalizes understanding to come back to hospital for delivery at [facility] tomorrow morning, 11/21/2017, and explained reasons to come back to hospital if needed before that time. Pt ambulated to ED door for discharge to go home in stable condition.

## 2017-11-20 NOTE — Progress Notes (Signed)
40w 6d, G2/P1 here with complaints of middle abdominal cramping/discomfort rated 1/10. Reports bleeding-spotting on pad with more blood present when wiping after using the restroom. Pt cervix was checked in the office yesterday. Reports positive fetal movement. Denies vaginal discharge, bleeding present as noted. Monitors applied/assessing.

## 2017-11-20 NOTE — Discharge Instructions (Signed)
Return to Labor and Delivery for your scheduled induction of labor tomorrow at 0630am. Call if you have any questions or concerns

## 2017-11-21 ENCOUNTER — Emergency Department
Admission: EM | Admit: 2017-11-21 | Discharge: 2017-11-21 | Disposition: A | Discharge status: Home / Self Care | Payer: Medicaid Other | Source: Home / Self Care

## 2017-11-21 ENCOUNTER — Other Ambulatory Visit: Payer: Self-pay

## 2017-11-21 ENCOUNTER — Inpatient Hospital Stay
Admission: RE | Admit: 2017-11-21 | Discharge: 2017-11-22 | DRG: 807 | Disposition: A | Discharge status: Home / Self Care | Payer: Managed Care, Other (non HMO) | Source: Ambulatory Visit | Attending: Obstetrics and Gynecology | Admitting: Obstetrics and Gynecology

## 2017-11-21 DIAGNOSIS — Z3490 Encounter for supervision of normal pregnancy, unspecified, unspecified trimester: Secondary | ICD-10-CM | POA: Diagnosis present

## 2017-11-21 DIAGNOSIS — O99214 Obesity complicating childbirth: Principal | ICD-10-CM | POA: Diagnosis present

## 2017-11-21 DIAGNOSIS — Z3483 Encounter for supervision of other normal pregnancy, third trimester: Secondary | ICD-10-CM | POA: Diagnosis present

## 2017-11-21 DIAGNOSIS — Z3A41 41 weeks gestation of pregnancy: Secondary | ICD-10-CM | POA: Diagnosis not present

## 2017-11-21 DIAGNOSIS — E669 Obesity, unspecified: Secondary | ICD-10-CM | POA: Diagnosis present

## 2017-11-21 LAB — TYPE AND SCREEN
ABO/RH(D): B POS
ANTIBODY SCREEN: NEGATIVE

## 2017-11-21 LAB — CBC
HCT: 34.9 % — ABNORMAL LOW (ref 35.0–47.0)
Hemoglobin: 11.7 g/dL — ABNORMAL LOW (ref 12.0–16.0)
MCH: 26.2 pg (ref 26.0–34.0)
MCHC: 33.4 g/dL (ref 32.0–36.0)
MCV: 78.6 fL — ABNORMAL LOW (ref 80.0–100.0)
PLATELETS: 250 10*3/uL (ref 150–440)
RBC: 4.45 MIL/uL (ref 3.80–5.20)
RDW: 14.6 % — ABNORMAL HIGH (ref 11.5–14.5)
WBC: 8.1 10*3/uL (ref 3.6–11.0)

## 2017-11-21 MED ORDER — PRENATAL MULTIVITAMIN CH
1.0000 | ORAL_TABLET | Freq: Every day | ORAL | Status: DC
  Administered 2017-11-21 – 2017-11-22 (×2): 1 via ORAL
  Filled 2017-11-21 (×2): qty 1

## 2017-11-21 MED ORDER — LACTATED RINGERS IV SOLN: 500.0000 mL | INTRAVENOUS | Status: DC | PRN

## 2017-11-21 MED ORDER — ZOLPIDEM TARTRATE 5 MG PO TABS: 5.0000 mg | ORAL_TABLET | Freq: Every evening | ORAL | Status: DC | PRN

## 2017-11-21 MED ORDER — HYDRALAZINE HCL 20 MG/ML IJ SOLN: 10.0000 mg | Freq: Once | INTRAMUSCULAR | Status: DC | PRN

## 2017-11-21 MED ORDER — MISOPROSTOL 200 MCG PO TABS
ORAL_TABLET | ORAL | Status: AC
  Filled 2017-11-21: qty 4

## 2017-11-21 MED ORDER — LACTATED RINGERS IV SOLN
INTRAVENOUS | Status: DC
  Administered 2017-11-21: 03:00:00 via INTRAVENOUS

## 2017-11-21 MED ORDER — DOCUSATE SODIUM 100 MG PO CAPS
100.0000 mg | ORAL_CAPSULE | Freq: Two times a day (BID) | ORAL | Status: DC
  Administered 2017-11-21 – 2017-11-22 (×2): 100 mg via ORAL
  Filled 2017-11-21 (×2): qty 1

## 2017-11-21 MED ORDER — ACETAMINOPHEN 325 MG PO TABS
650.0000 mg | ORAL_TABLET | ORAL | Status: DC | PRN
  Filled 2017-11-21 (×3): qty 2

## 2017-11-21 MED ORDER — OXYTOCIN 40 UNITS IN LACTATED RINGERS INFUSION - SIMPLE MED
INTRAVENOUS | Status: AC
  Administered 2017-11-21: 500 mL via INTRAVENOUS
  Filled 2017-11-21: qty 1000

## 2017-11-21 MED ORDER — LABETALOL HCL 5 MG/ML IV SOLN
20.0000 mg | INTRAVENOUS | Status: DC | PRN
Start: 2017-11-21 — End: 2017-11-22
  Administered 2017-11-21: 20 mg via INTRAVENOUS
  Filled 2017-11-21: qty 16

## 2017-11-21 MED ORDER — TETANUS-DIPHTH-ACELL PERTUSSIS 5-2.5-18.5 LF-MCG/0.5 IM SUSP: 0.5000 mL | Freq: Once | INTRAMUSCULAR | Status: DC

## 2017-11-21 MED ORDER — SOD CITRATE-CITRIC ACID 500-334 MG/5ML PO SOLN: 30.0000 mL | ORAL | Status: DC | PRN

## 2017-11-21 MED ORDER — SIMETHICONE 80 MG PO CHEW: 80.0000 mg | CHEWABLE_TABLET | ORAL | Status: DC | PRN

## 2017-11-21 MED ORDER — ACETAMINOPHEN 325 MG PO TABS
650.0000 mg | ORAL_TABLET | ORAL | Status: DC | PRN
  Administered 2017-11-21 (×3): 650 mg via ORAL

## 2017-11-21 MED ORDER — ONDANSETRON HCL 4 MG/2ML IJ SOLN
4.0000 mg | Freq: Four times a day (QID) | INTRAMUSCULAR | Status: DC | PRN
  Administered 2017-11-21: 4 mg via INTRAVENOUS
  Filled 2017-11-21: qty 2

## 2017-11-21 MED ORDER — OXYTOCIN BOLUS FROM INFUSION
500.0000 mL | Freq: Once | INTRAVENOUS | Status: AC
  Administered 2017-11-21: 500 mL via INTRAVENOUS

## 2017-11-21 MED ORDER — BENZOCAINE-MENTHOL 20-0.5 % EX AERO: 1.0000 "application " | INHALATION_SPRAY | CUTANEOUS | Status: DC | PRN

## 2017-11-21 MED ORDER — OXYTOCIN 10 UNIT/ML IJ SOLN
INTRAMUSCULAR | Status: AC
  Filled 2017-11-21: qty 2

## 2017-11-21 MED ORDER — LABETALOL HCL 5 MG/ML IV SOLN
INTRAVENOUS | Status: AC
  Administered 2017-11-21: 20 mg via INTRAVENOUS
  Filled 2017-11-21: qty 4

## 2017-11-21 MED ORDER — BUTORPHANOL TARTRATE 1 MG/ML IJ SOLN
1.0000 mg | INTRAMUSCULAR | Status: DC | PRN
  Administered 2017-11-21: 1 mg via INTRAVENOUS
  Filled 2017-11-21: qty 1

## 2017-11-21 MED ORDER — LIDOCAINE HCL (PF) 1 % IJ SOLN: 30.0000 mL | INTRAMUSCULAR | Status: DC | PRN

## 2017-11-21 MED ORDER — OXYCODONE-ACETAMINOPHEN 5-325 MG PO TABS: 1.0000 | ORAL_TABLET | ORAL | Status: DC | PRN

## 2017-11-21 MED ORDER — DIPHENHYDRAMINE HCL 25 MG PO CAPS: 25.0000 mg | ORAL_CAPSULE | Freq: Four times a day (QID) | ORAL | Status: DC | PRN

## 2017-11-21 MED ORDER — OXYTOCIN 40 UNITS IN LACTATED RINGERS INFUSION - SIMPLE MED
2.5000 [IU]/h | INTRAVENOUS | Status: DC | PRN
  Filled 2017-11-21: qty 1000

## 2017-11-21 MED ORDER — SODIUM CHLORIDE 0.9 % IV SOLN: 2.0000 g | Freq: Once | INTRAVENOUS | Status: DC

## 2017-11-21 MED ORDER — LIDOCAINE HCL (PF) 1 % IJ SOLN
INTRAMUSCULAR | Status: AC
  Filled 2017-11-21: qty 30

## 2017-11-21 MED ORDER — AMMONIA AROMATIC IN INHA
RESPIRATORY_TRACT | Status: AC
  Filled 2017-11-21: qty 10

## 2017-11-21 MED ORDER — OXYTOCIN 40 UNITS IN LACTATED RINGERS INFUSION - SIMPLE MED: 2.5000 [IU]/h | INTRAVENOUS | Status: DC

## 2017-11-21 MED ORDER — IBUPROFEN 600 MG PO TABS
600.0000 mg | ORAL_TABLET | Freq: Four times a day (QID) | ORAL | Status: DC
  Administered 2017-11-21 – 2017-11-22 (×6): 600 mg via ORAL
  Filled 2017-11-21 (×6): qty 1

## 2017-11-21 NOTE — Progress Notes (Signed)
Notify Abby, RN of bp

## 2017-11-21 NOTE — Progress Notes (Signed)
   11/21/17 1115  Clinical Encounter Type  Visited With Patient  Visit Type Initial (order for advanced directive)  Referral From Nurse  Consult/Referral To Chaplain   Chaplain responded to order regarding advanced directives.  Chaplain outlined HCPOA and living will sections with patient.  Patient wants to review document.  Chaplain encouraged her to have chaplain paged with any questions regarding document and/or when she was ready to complete.

## 2017-11-21 NOTE — H&P (Signed)
History and Physical   HPI  Joanne Shannon is a 32 y.o. G2P1001 at [redacted]w[redacted]d Estimated Date of Delivery: 11/14/17 who is being admitted for  labor management   OB History  OB History  Gravida Para Term Preterm AB Living  2 1 1  0 0 1  SAB TAB Ectopic Multiple Live Births  0 0 0 0 1    # Outcome Date GA Lbr Len/2nd Weight Sex Delivery Anes PTL Lv  2 Current           1 Term 2008 [redacted]w[redacted]d  7 lb 3.2 oz (3.266 kg) M Vag-Spont   LIV    PROBLEM LIST  Pregnancy complications or risks: Patient Active Problem List   Diagnosis Date Noted  . Encounter for induction of labor 11/21/2017  . Cramping affecting pregnancy, antepartum 11/20/2017  . Poor weight gain of pregnancy, third trimester 08/21/2017  . Goiter 05/03/2017  . Obesity (BMI 30-39.9) 05/03/2017  . Threatened abortion 03/21/2017  . Adnexal mass 03/21/2017    Prenatal labs and studies: Rubella: 2.37 (09/10 0932) RPR: Non Reactive (01/17 0945)  HBsAg: Negative (09/10 0932)  HIV: Non Reactive (09/10 0932)  GBS: Neg   Past Medical History:  Diagnosis Date  . Anxiety   . Enlarged thyroid      Past Surgical History:  Procedure Laterality Date  . NO PAST SURGERIES       Medications    Current Discharge Medication List    CONTINUE these medications which have NOT CHANGED   Details  Cetirizine HCl (ZYRTEC PO) Take by mouth.    docusate sodium (COLACE) 100 MG capsule Take 1 capsule (100 mg total) by mouth 2 (two) times daily as needed. Qty: 30 capsule, Refills: 2    folic acid (FOLVITE) 1 MG tablet Take by mouth.    Prenatal-DSS-FeCb-FeGl-FA (CITRANATAL BLOOM) 90-1 MG TABS Take 1 tablet by mouth daily. Qty: 30 tablet, Refills: 6    Polyvinyl Alcohol-Povidone (CLEAR EYES ALL SEASONS) 5-6 MG/ML SOLN Apply to eye.         Allergies  Patient has no known allergies.  Review of Systems  Pertinent items are noted in HPI.  Physical Exam  BP (!) 175/96 (BP Location: Right Arm)   Pulse 93   Temp (!)  97.5 F (36.4 C) (Oral)   Resp 18   Ht 5\' 8"  (1.727 m)   Wt 258 lb (117 kg)   LMP 02/07/2017   BMI 39.23 kg/m   Lungs:  CTA B Cardio: RRR without M/R/G Abd: Soft, gravid, NT Presentation: cephalic EXT: No C/C/ 1+ Edema DTRs: 2+ B CERVIX: 6  :    See Prenatal records for more detailed PE.     FHR:  Variability: Good {> 6 bpm)  Toco: Uterine Contractions: q4 min   Test Results  Results for orders placed or performed during the hospital encounter of 11/21/17 (from the past 24 hour(s))  CBC     Status: Abnormal   Collection Time: 11/21/17  3:32 AM  Result Value Ref Range   WBC 8.1 3.6 - 11.0 K/uL   RBC 4.45 3.80 - 5.20 MIL/uL   Hemoglobin 11.7 (L) 12.0 - 16.0 g/dL   HCT 34.9 (L) 35.0 - 47.0 %   MCV 78.6 (L) 80.0 - 100.0 fL   MCH 26.2 26.0 - 34.0 pg   MCHC 33.4 32.0 - 36.0 g/dL   RDW 14.6 (H) 11.5 - 14.5 %   Platelets 250 150 - 440 K/uL  Type  and screen [place]     Status: None (Preliminary result)   Collection Time: 11/21/17  3:32 AM  Result Value Ref Range   ABO/RH(D) PENDING    Antibody Screen PENDING    Sample Expiration      11/24/2017 Performed at [facility], [address] [place], [place] [address]    Group B Strep negative  Assessment   G2P1001 at [redacted]w[redacted]d Estimated Date of Delivery: 11/14/17  The fetus is reassuring.   Patient Active Problem List   Diagnosis Date Noted  . Encounter for induction of labor 11/21/2017  . Cramping affecting pregnancy, antepartum 11/20/2017  . Poor weight gain of pregnancy, third trimester 08/21/2017  . Goiter 05/03/2017  . Obesity (BMI 30-39.9) 05/03/2017  . Threatened abortion 03/21/2017  . Adnexal mass 03/21/2017    Plan  1. Admit to L&D :   expectant management 2. EFM: -- Category 1 3. Epidural if desired. Stadol for IV pain until epidural requested. 4. Admission labs    Finis Bud, M.D. 11/21/2017 4:29 AM

## 2017-11-22 LAB — RPR: RPR Ser Ql: NONREACTIVE

## 2017-11-22 MED ORDER — IBUPROFEN 800 MG PO TABS: 800.0000 mg | ORAL_TABLET | Freq: Three times a day (TID) | ORAL | 1 refills | Status: AC | PRN

## 2017-11-22 NOTE — Discharge Summary (Signed)
Obstetric Discharge Summary Reason for Admission: onset of labor at [redacted] weeks gestation Prenatal Procedures: ultrasound Intrapartum Procedures: spontaneous vaginal delivery Postpartum Procedures: none Complications-Operative and Postpartum: 1st degree perineal laceration   Hemoglobin  Date Value Ref Range Status  11/21/2017 11.7 (L) 12.0 - 16.0 g/dL Final  08/21/2017 11.1 11.1 - 15.9 g/dL Final   HCT  Date Value Ref Range Status  11/21/2017 34.9 (L) 35.0 - 47.0 % Final   Hematocrit  Date Value Ref Range Status  08/21/2017 32.7 (L) 34.0 - 46.6 % Final    Physical Exam:  Blood pressure 119/78, pulse 79, temperature 98 F (36.7 C), temperature source Oral, resp. rate 18, height 5\' 8"  (1.727 m), weight 258 lb (117 kg), last menstrual period 02/07/2017, SpO2 100 %, unknown if currently breastfeeding.  General: alert and no distress Lochia: appropriate Uterine Fundus: firm Incision: not applicable DVT Evaluation: No evidence of DVT seen on physical exam. Negative Homan's sign. No cords or calf tenderness. No significant calf/ankle edema.  Discharge Diagnoses: Term Pregnancy-delivered  Discharge Information: Date: 11/22/2017 Activity: pelvic rest Diet: routine Medications: PNV, Ibuprofen and Colace Condition: stable Instructions: refer to practice specific booklet Discharge to: home Follow-up Information    Harlin Heys, MD Follow up in 6 week(s).   Specialty:  Obstetrics and Gynecology Why:  Postpartum visit, and ultrasound to f/u left ovarian cyst Contact information: [place] [address] [phone]           Newborn Data: Live born female  Birth Weight: 7 lb 11.1 oz (3490 g) APGAR: 8, 9  Newborn Delivery   Birth date/time:  11/21/2017 05:28:00 Delivery type:  Vaginal, Spontaneous     Home with mother.  Rubie Maid 11/22/2017, 11:46 AM

## 2017-11-22 NOTE — Progress Notes (Signed)
Pt discharged with infant.  Discharge instructions, prescriptions and follow up appointment given to and reviewed with pt. Pt verbalized understanding. Escorted out by auxillary. 

## 2017-11-22 NOTE — Progress Notes (Signed)
Post Partum Day # 1, s/p SVD  Subjective: no complaints, up ad lib, voiding and tolerating PO.  Pain well controlled.  Objective: Temp:  [97.8 F (36.6 C)-98 F (36.7 C)] 98 F (36.7 C) (04/20 0822) Pulse Rate:  [79-89] 79 (04/20 0822) Resp:  [18-20] 18 (04/20 0822) BP: (118-126)/(67-78) 119/78 (04/20 0822) SpO2:  [98 %-100 %] 100 % (04/20 5573)  Physical Exam:  General: alert and no distress  Lungs: clear to auscultation bilaterally Breasts: normal appearance, no masses or tenderness Heart: regular rate and rhythm, S1, S2 normal, no murmur, click, rub or gallop Abdomen: soft, non-tender; bowel sounds normal; no masses,  no organomegaly Pelvis: Lochia: appropriate, Uterine Fundus: firm Extremities: DVT Evaluation: No evidence of DVT seen on physical exam. Negative Homan's sign. No cords or calf tenderness. No significant calf/ankle edema.  Recent Labs    11/21/17 0332  HGB 11.7*  HCT 34.9*    Assessment/Plan: Doing well postpartum Ambulating and tolerating diet Bottle feeding Contraception undecided, considering OCPs vs hormonal patch. To discuss further at postpartum visit.  Discharge home today.    LOS: 1 day   Rubie Maid, MD [facility] 11/22/2017 11:38 AM

## 2017-11-25 ENCOUNTER — Telehealth: Payer: Self-pay | Admitting: *Deleted

## 2017-11-25 NOTE — Telephone Encounter (Signed)
Marcie Bal Calling from [employer] needs Clinical information pertaining to the patient  Short term Disability. Patient recorded leave of absence on 4/12 and a possible date of 4/15 before the delivery date . Marcie Bal states she needs medical information on the reason she was out before her delivery date. She is requesting a call back. Her contact # is  [phone] ext [phone]. Please advise. Thank you

## 2017-12-24 ENCOUNTER — Other Ambulatory Visit: Payer: Self-pay | Admitting: Obstetrics and Gynecology

## 2017-12-24 DIAGNOSIS — Z01419 Encounter for gynecological examination (general) (routine) without abnormal findings: Secondary | ICD-10-CM

## 2018-01-02 ENCOUNTER — Ambulatory Visit (INDEPENDENT_AMBULATORY_CARE_PROVIDER_SITE_OTHER): Payer: Managed Care, Other (non HMO)

## 2018-01-02 ENCOUNTER — Encounter: Payer: Self-pay | Admitting: Obstetrics and Gynecology

## 2018-01-02 ENCOUNTER — Ambulatory Visit (INDEPENDENT_AMBULATORY_CARE_PROVIDER_SITE_OTHER): Payer: Managed Care, Other (non HMO) | Admitting: Obstetrics and Gynecology

## 2018-01-02 VITALS — BP 142/86 | HR 84 | Ht 68.0 in | Wt 245.1 lb

## 2018-01-02 DIAGNOSIS — Z1501 Genetic susceptibility to malignant neoplasm of breast: Secondary | ICD-10-CM

## 2018-01-02 DIAGNOSIS — N9489 Other specified conditions associated with female genital organs and menstrual cycle: Secondary | ICD-10-CM

## 2018-01-02 DIAGNOSIS — Z01419 Encounter for gynecological examination (general) (routine) without abnormal findings: Secondary | ICD-10-CM

## 2018-01-02 DIAGNOSIS — R1909 Other intra-abdominal and pelvic swelling, mass and lump: Secondary | ICD-10-CM

## 2018-01-02 DIAGNOSIS — N83201 Unspecified ovarian cyst, right side: Secondary | ICD-10-CM

## 2018-01-02 DIAGNOSIS — N949 Unspecified condition associated with female genital organs and menstrual cycle: Secondary | ICD-10-CM

## 2018-01-02 NOTE — Progress Notes (Signed)
HPI:      [name] is a 32 y.o. [phone] who LMP was No LMP recorded (lmp unknown).  Subjective:   She presents today 6 weeks postpartum.  She also has a left adnexal mass suspected to be a dermoid.  She has had multiple ultrasounds and an MRI.  She had an ultrasound today. She is generally asymptomatic although it occasionally causes her discomfort. Patient would like to go back to work. She has not chosen any birth control method yet and has not had intercourse since delivery.    Hx: The following portions of the patient's history were reviewed and updated as appropriate:             She  has a past medical history of Anxiety and Enlarged thyroid. She does not have any pertinent problems on file. She  has a past surgical history that includes No past surgeries. Her family history includes Diabetes in her sister; Berenice Primas' disease in her father. She  reports that she has quit smoking. Her smoking use included cigars. She has never used smokeless tobacco. She reports that she does not drink alcohol or use drugs. She has a current medication list which includes the following prescription(s): cetirizine hcl, docusate sodium, ibuprofen, polyvinyl alcohol-povidone, and citranatal bloom. She has No Known Allergies.       Review of Systems:  Review of Systems  Constitutional: Denied constitutional symptoms, night sweats, recent illness, fatigue, fever, insomnia and weight loss.  Eyes: Denied eye symptoms, eye pain, photophobia, vision change and visual disturbance.  Ears/Nose/Throat/Neck: Denied ear, nose, throat or neck symptoms, hearing loss, nasal discharge, sinus congestion and sore throat.  Cardiovascular: Denied cardiovascular symptoms, arrhythmia, chest pain/pressure, edema, exercise intolerance, orthopnea and palpitations.  Respiratory: Denied pulmonary symptoms, asthma, pleuritic pain, productive sputum, cough, dyspnea and wheezing.  Gastrointestinal: Denied, gastro-esophageal  reflux, melena, nausea and vomiting.  Genitourinary: Denied genitourinary symptoms including symptomatic vaginal discharge, pelvic relaxation issues, and urinary complaints.  Musculoskeletal: Denied musculoskeletal symptoms, stiffness, swelling, muscle weakness and myalgia.  Dermatologic: Denied dermatology symptoms, rash and scar.  Neurologic: Denied neurology symptoms, dizziness, headache, neck pain and syncope.  Psychiatric: Denied psychiatric symptoms, anxiety and depression.  Endocrine: Denied endocrine symptoms including hot flashes and night sweats.   Meds:   Current Outpatient Medications on File Prior to Visit  Medication Sig Dispense Refill  . Cetirizine HCl (ZYRTEC PO) Take by mouth.    . docusate sodium (COLACE) 100 MG capsule Take 1 capsule (100 mg total) by mouth 2 (two) times daily as needed. 30 capsule 2  . ibuprofen (ADVIL,MOTRIN) 800 MG tablet Take 1 tablet (800 mg total) by mouth every 8 (eight) hours as needed. 60 tablet 1  . Polyvinyl Alcohol-Povidone (CLEAR EYES ALL SEASONS) 5-6 MG/ML SOLN Apply to eye.    . Prenatal-DSS-FeCb-FeGl-FA (CITRANATAL BLOOM) 90-1 MG TABS Take 1 tablet by mouth daily. 30 tablet 6   No current facility-administered medications on file prior to visit.     Objective:     Vitals:   01/02/18 1031  BP: (!) 142/86  Pulse: 84              Physical examination   Pelvic:   Vulva: Normal appearance.  No lesions.  Vagina: No lesions or abnormalities noted.  Support: Normal pelvic support.  Urethra No masses tenderness or scarring.  Meatus Normal size without lesions or prolapse.  Cervix: Normal appearance.  No lesions.  Anus: Normal exam.  No lesions.  Perineum: Normal exam.  No lesions.        Bimanual   Uterus: Normal size.  Non-tender.  Mobile.  AV.  Adnexae: No masses.  Non-tender to palpation.  Cul-de-sac: Negative for abnormality.   Ultrasound results reviewed directly with the patient.  Assessment:    [id number] Patient Active  Problem List   Diagnosis Date Noted  . Encounter for induction of labor 11/21/2017  . Cramping affecting pregnancy, antepartum 11/20/2017  . Poor weight gain of pregnancy, third trimester 08/21/2017  . Goiter 05/03/2017  . Obesity (BMI 30-39.9) 05/03/2017  . Threatened abortion 03/21/2017  . Adnexal mass 03/21/2017     1. Adnexal mass   2. 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  Patient doing well postpartum.   Plan:            1.  Patient may return to work.  2.  Roma score  3.  Laparoscopic left cystectomy/nephrectomy seems indicated if Roma score appropriate.  I have discussed this in detail with the patient.  Orders Orders Placed This Encounter  Procedures  . Ovarian Malignancy Risk-ROMA    No orders of the defined types were placed in this encounter.     F/U  No follow-ups on file.  Finis Bud, M.D. 01/02/2018 11:22 AM

## 2018-01-03 LAB — OVARIAN MALIGNANCY RISK-ROMA
Cancer Antigen (CA) 125: 15.4 U/mL (ref 0.0–38.1)
HE4: 38 pmol/L (ref 0.0–61.2)
POSTMENOPAUSAL ROMA: 0.91
PREMENOPAUSAL ROMA: 0.4

## 2018-01-03 LAB — POSTMENOPAUSAL INTERP: LOW

## 2018-01-03 LAB — PREMENOPAUSAL INTERP: LOW

## 2018-01-12 ENCOUNTER — Encounter: Payer: Self-pay | Admitting: Obstetrics and Gynecology

## 2018-01-12 ENCOUNTER — Ambulatory Visit (INDEPENDENT_AMBULATORY_CARE_PROVIDER_SITE_OTHER): Payer: Managed Care, Other (non HMO) | Admitting: Obstetrics and Gynecology

## 2018-01-12 VITALS — BP 150/94 | HR 88 | Ht 68.0 in | Wt 247.2 lb

## 2018-01-12 DIAGNOSIS — N949 Unspecified condition associated with female genital organs and menstrual cycle: Secondary | ICD-10-CM

## 2018-01-12 DIAGNOSIS — N9489 Other specified conditions associated with female genital organs and menstrual cycle: Secondary | ICD-10-CM

## 2018-01-12 NOTE — Progress Notes (Signed)
HPI:      [name] is a 32 y.o. F7P1025 who LMP was Patient's last menstrual period was 01/09/2018.  Subjective:   She presents today to discuss her Roma score.  She also states she is having no pain from her adnexal mass.  She would like definitive surgery in October if possible.    Hx: The following portions of the patient's history were reviewed and updated as appropriate:             She  has a past medical history of Anxiety and Enlarged thyroid. She does not have any pertinent problems on file. She  has a past surgical history that includes No past surgeries. Her family history includes Diabetes in her sister; Berenice Primas' disease in her father. She  reports that she has quit smoking. Her smoking use included cigars. She has never used smokeless tobacco. She reports that she does not drink alcohol or use drugs. She has a current medication list which includes the following prescription(s): cetirizine hcl, docusate sodium, ibuprofen, polyvinyl alcohol-povidone, and citranatal bloom. She has No Known Allergies.       Review of Systems:  Review of Systems  Constitutional: Denied constitutional symptoms, night sweats, recent illness, fatigue, fever, insomnia and weight loss.  Eyes: Denied eye symptoms, eye pain, photophobia, vision change and visual disturbance.  Ears/Nose/Throat/Neck: Denied ear, nose, throat or neck symptoms, hearing loss, nasal discharge, sinus congestion and sore throat.  Cardiovascular: Denied cardiovascular symptoms, arrhythmia, chest pain/pressure, edema, exercise intolerance, orthopnea and palpitations.  Respiratory: Denied pulmonary symptoms, asthma, pleuritic pain, productive sputum, cough, dyspnea and wheezing.  Gastrointestinal: Denied, gastro-esophageal reflux, melena, nausea and vomiting.  Genitourinary: Denied genitourinary symptoms including symptomatic vaginal discharge, pelvic relaxation issues, and urinary complaints.  Musculoskeletal: Denied  musculoskeletal symptoms, stiffness, swelling, muscle weakness and myalgia.  Dermatologic: Denied dermatology symptoms, rash and scar.  Neurologic: Denied neurology symptoms, dizziness, headache, neck pain and syncope.  Psychiatric: Denied psychiatric symptoms, anxiety and depression.  Endocrine: Denied endocrine symptoms including hot flashes and night sweats.   Meds:   Current Outpatient Medications on File Prior to Visit  Medication Sig Dispense Refill  . Cetirizine HCl (ZYRTEC PO) Take by mouth.    . docusate sodium (COLACE) 100 MG capsule Take 1 capsule (100 mg total) by mouth 2 (two) times daily as needed. 30 capsule 2  . ibuprofen (ADVIL,MOTRIN) 800 MG tablet Take 1 tablet (800 mg total) by mouth every 8 (eight) hours as needed. 60 tablet 1  . Polyvinyl Alcohol-Povidone (CLEAR EYES ALL SEASONS) 5-6 MG/ML SOLN Apply to eye.    . Prenatal-DSS-FeCb-FeGl-FA (CITRANATAL BLOOM) 90-1 MG TABS Take 1 tablet by mouth daily. 30 tablet 6   No current facility-administered medications on file prior to visit.     Objective:     Vitals:   01/12/18 0850  BP: (!) 150/94  Pulse: 88                Assessment:    G2P2002 Patient Active Problem List   Diagnosis Date Noted  . Encounter for induction of labor 11/21/2017  . Cramping affecting pregnancy, antepartum 11/20/2017  . Poor weight gain of pregnancy, third trimester 08/21/2017  . Goiter 05/03/2017  . Obesity (BMI 30-39.9) 05/03/2017  . Threatened abortion 03/21/2017  . Adnexal mass 03/21/2017     1. 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    Plan:  1.  I discussed teratomas in detail.  Surgery discussed in detail.  Patient would like her surgery in October.  To present in September for preop.  Will likely need another ultrasound prior to surgery to confirm size and presence of tumor. Orders No orders of the defined types were placed in this  encounter.   No orders of the defined types were placed in this encounter.     F/U  No follow-ups on file. I spent 18 minutes involved in the care of this patient of which greater than 50% was spent discussing Roma scores, risk of cancer, teratoma, types of surgery, timing of surgery, all questions answered.  Finis Bud, M.D. 01/12/2018 10:00 AM

## 2018-04-02 ENCOUNTER — Other Ambulatory Visit: Payer: Self-pay

## 2018-04-02 ENCOUNTER — Ambulatory Visit: Payer: Managed Care, Other (non HMO)

## 2018-04-02 ENCOUNTER — Ambulatory Visit
Admission: EM | Admit: 2018-04-02 | Discharge: 2018-04-02 | Disposition: A | Discharge status: Home / Self Care | Payer: Managed Care, Other (non HMO) | Attending: Family Medicine | Admitting: Family Medicine

## 2018-04-02 ENCOUNTER — Encounter: Payer: Self-pay | Admitting: Emergency Medicine

## 2018-04-02 DIAGNOSIS — R1032 Left lower quadrant pain: Secondary | ICD-10-CM | POA: Diagnosis not present

## 2018-04-02 DIAGNOSIS — K5909 Other constipation: Secondary | ICD-10-CM | POA: Diagnosis not present

## 2018-04-02 DIAGNOSIS — I10 Essential (primary) hypertension: Secondary | ICD-10-CM | POA: Insufficient documentation

## 2018-04-02 DIAGNOSIS — R112 Nausea with vomiting, unspecified: Secondary | ICD-10-CM | POA: Diagnosis not present

## 2018-04-02 DIAGNOSIS — Z79899 Other long term (current) drug therapy: Secondary | ICD-10-CM | POA: Insufficient documentation

## 2018-04-02 DIAGNOSIS — F419 Anxiety disorder, unspecified: Secondary | ICD-10-CM | POA: Insufficient documentation

## 2018-04-02 DIAGNOSIS — Z791 Long term (current) use of non-steroidal anti-inflammatories (NSAID): Secondary | ICD-10-CM | POA: Insufficient documentation

## 2018-04-02 DIAGNOSIS — F17290 Nicotine dependence, other tobacco product, uncomplicated: Secondary | ICD-10-CM | POA: Insufficient documentation

## 2018-04-02 DIAGNOSIS — D509 Iron deficiency anemia, unspecified: Secondary | ICD-10-CM | POA: Insufficient documentation

## 2018-04-02 HISTORY — DX: Essential (primary) hypertension: I10

## 2018-04-02 LAB — URINALYSIS, COMPLETE (UACMP) WITH MICROSCOPIC
GLUCOSE, UA: NEGATIVE mg/dL
Ketones, ur: 40 mg/dL — AB
Leukocytes, UA: NEGATIVE
Nitrite: NEGATIVE
Protein, ur: 100 mg/dL — AB
SPECIFIC GRAVITY, URINE: 1.025 (ref 1.005–1.030)
WBC, UA: NONE SEEN WBC/hpf (ref 0–5)
pH: 5.5 (ref 5.0–8.0)

## 2018-04-02 LAB — PREGNANCY, URINE: Preg Test, Ur: NEGATIVE

## 2018-04-02 MED ORDER — ONDANSETRON 8 MG PO TBDP: 8.0000 mg | ORAL_TABLET | Freq: Two times a day (BID) | ORAL | 0 refills | Status: AC

## 2018-04-02 MED ORDER — KETOROLAC TROMETHAMINE 60 MG/2ML IM SOLN
30.0000 mg | Freq: Once | INTRAMUSCULAR | Status: AC
  Administered 2018-04-02: 30 mg via INTRAMUSCULAR

## 2018-04-02 MED ORDER — AMOXICILLIN-POT CLAVULANATE 875-125 MG PO TABS: 1.0000 | ORAL_TABLET | Freq: Two times a day (BID) | ORAL | 0 refills | Status: AC

## 2018-04-02 MED ORDER — ONDANSETRON 8 MG PO TBDP
8.0000 mg | ORAL_TABLET | Freq: Once | ORAL | Status: AC
  Administered 2018-04-02: 8 mg via ORAL

## 2018-04-02 MED ORDER — NAPROXEN 500 MG PO TABS: 500.0000 mg | ORAL_TABLET | Freq: Two times a day (BID) | ORAL | 0 refills | Status: DC

## 2018-04-02 NOTE — ED Triage Notes (Signed)
Patient in today stating that she started with a cold and body aches x 4 days. N/V and abdominal pain started yesterday. Patient denies diarrhea. Patient has had chills and sweats.

## 2018-04-02 NOTE — Discharge Instructions (Signed)
If you worsen or not improving within several days go to the emergency room follow-up with your primary care physician

## 2018-04-02 NOTE — ED Provider Notes (Signed)
[facility]    CSN: [id number] Arrival date & time: 04/02/18  1651     History   Chief Complaint Chief Complaint  Patient presents with  . Nausea  . Emesis  . Abdominal Pain    HPI Joanne Shannon is a 32 y.o. female.   HPI  32 year old female presents cold and body aches that started approximately 4 days ago.  She started experiencing nausea vomiting and abdominal pain.  She noticed it after beginning taking ferrous sulfate for iron deficiency anemia.  She forgot to take a stool softener along with the pill.  She presents with left lower quadrant pain she classifies as a 6 or 7 out of 10 when it is at its least and 9 out of 10 when it is worse.  Coms and goes.  Had multiple episodes of vomiting today.        Past Medical History:  Diagnosis Date  . Anxiety   . Enlarged thyroid   . Hypertension     Patient Active Problem List   Diagnosis Date Noted  . Encounter for induction of labor 11/21/2017  . Cramping affecting pregnancy, antepartum 11/20/2017  . Poor weight gain of pregnancy, third trimester 08/21/2017  . Goiter 05/03/2017  . Obesity (BMI 30-39.9) 05/03/2017  . Threatened abortion 03/21/2017  . Adnexal mass 03/21/2017    Past Surgical History:  Procedure Laterality Date  . NO PAST SURGERIES      OB History    Gravida  2   Para  2   Term  2   Preterm      AB      Living  2     SAB      TAB      Ectopic      Multiple  0   Live Births  2            Home Medications    Prior to Admission medications   Medication Sig Start Date End Date Taking? Authorizing Provider  Cetirizine HCl (ZYRTEC PO) Take by mouth.   Yes [provider]  chlorthalidone (HYGROTON) 25 MG tablet Take 1 tablet by mouth daily. 03/30/18 03/30/19 Yes [provider]  docusate sodium (COLACE) 100 MG capsule Take 1 capsule (100 mg total) by mouth 2 (two) times daily as needed. 10/15/17  Yes Rubie Maid, MD  ibuprofen (ADVIL,MOTRIN)  800 MG tablet Take 1 tablet (800 mg total) by mouth every 8 (eight) hours as needed. 11/22/17  Yes Rubie Maid, MD  Polyvinyl Alcohol-Povidone (CLEAR EYES ALL SEASONS) 5-6 MG/ML SOLN Apply to eye.   Yes [provider]  amoxicillin-clavulanate (AUGMENTIN) 875-125 MG tablet Take 1 tablet by mouth every 12 (twelve) hours. 04/02/18   Lorin Picket, PA-C  naproxen (NAPROSYN) 500 MG tablet Take 1 tablet (500 mg total) by mouth 2 (two) times daily with a meal. 04/02/18   Lorin Picket, PA-C  ondansetron (ZOFRAN ODT) 8 MG disintegrating tablet Take 1 tablet (8 mg total) by mouth 2 (two) times daily. 04/02/18   Lorin Picket, PA-C    Family History Family History  Problem Relation Age of Onset  . Diabetes Sister   . Graves' disease Father   . Breast cancer Neg Hx   . Ovarian cancer Neg Hx   . Colon cancer Neg Hx     Social History Social History   Tobacco Use  . Smoking status: Current Some Day Smoker    Types: Cigars  . Smokeless  tobacco: Never Used  Substance Use Topics  . Alcohol use: No  . Drug use: No     Allergies   Patient has no known allergies.   Review of Systems Review of Systems  Constitutional: Positive for activity change, appetite change, chills and fatigue. Negative for fever.  Gastrointestinal: Positive for abdominal pain and constipation. Negative for abdominal distention, blood in stool, diarrhea, nausea and vomiting.  Genitourinary: Negative for dysuria, flank pain, frequency and urgency.  All other systems reviewed and are negative.    Physical Exam Triage Vital Signs ED Triage Vitals  Enc Vitals Group     BP 04/02/18 1706 (!) 148/102     Pulse Rate 04/02/18 1706 (!) 105     Resp 04/02/18 1706 16     Temp 04/02/18 1706 98.6 F (37 C)     Temp Source 04/02/18 1706 Oral     SpO2 04/02/18 1706 99 %     Weight 04/02/18 1707 247 lb (112 kg)     Height 04/02/18 1707 5\' 8"  (1.727 m)     Head Circumference --      Peak Flow --       Pain Score 04/02/18 1707 6     Pain Loc --      Pain Edu? --      Excl. in [place]? --    No data found.  Updated Vital Signs BP (!) 148/102 (BP Location: Left Arm)   Pulse (!) 105   Temp 98.6 F (37 C) (Oral)   Resp 16   Ht 5\' 8"  (1.727 m)   Wt 247 lb (112 kg)   LMP 03/21/2018   SpO2 99%   BMI 37.56 kg/m   Visual Acuity Right Eye Distance:   Left Eye Distance:   Bilateral Distance:    Right Eye Near:   Left Eye Near:    Bilateral Near:     Physical Exam  Constitutional: She is oriented to person, place, and time. She appears well-developed and well-nourished.  Non-toxic appearance. She appears ill. No distress.  HENT:  Head: Normocephalic.  Eyes: Pupils are equal, round, and reactive to light.  Pulmonary/Chest: Effort normal and breath sounds normal.  Abdominal: Soft. Normal appearance. She exhibits no distension, no fluid wave and no mass. Bowel sounds are decreased. There is tenderness in the left lower quadrant. There is no rigidity, no rebound, no guarding, no CVA tenderness, no tenderness at McBurney's point and negative Murphy's sign.  Neurological: She is oriented to person, place, and time.  Skin: Skin is warm and dry.  Psychiatric: She has a normal mood and affect. Her behavior is normal.  Nursing note and vitals reviewed.    UC Treatments / Results  Labs (all labs ordered are listed, but only abnormal results are displayed) Labs Reviewed  URINALYSIS, COMPLETE (UACMP) WITH MICROSCOPIC - Abnormal; Notable for the following components:      Result Value   APPearance HAZY (*)    Hgb urine dipstick TRACE (*)    Bilirubin Urine SMALL (*)    Ketones, ur 40 (*)    Protein, ur 100 (*)    Bacteria, UA RARE (*)    All other components within normal limits  PREGNANCY, URINE    EKG None  Radiology Dg Abd 2 Views  Result Date: 04/02/2018 CLINICAL DATA:  Nausea vomiting and abdominal pain. EXAM: ABDOMEN - 2 VIEW COMPARISON:  None. FINDINGS: The bowel gas  pattern is normal. There is no evidence of free air.  No radio-opaque calculi or other significant radiographic abnormality is seen. IMPRESSION: Negative. Electronically Signed   By: Fidela Salisbury M.D.   On: 04/02/2018 18:49    Procedures Procedures (including critical care time)  Medications Ordered in UC Medications  ondansetron (ZOFRAN-ODT) disintegrating tablet 8 mg (8 mg Oral Given 04/02/18 1842)  ketorolac (TORADOL) injection 30 mg (30 mg Intramuscular Given 04/02/18 1843)    Initial Impression / Assessment and Plan / UC Course  I have reviewed the triage vital signs and the nursing notes.  Pertinent labs & imaging results that were available during my care of the patient were reviewed by me and considered in my medical decision making (see chart for details).     Plan: 1. Test/x-ray results and diagnosis reviewed with patient 2. rx as per orders; risks, benefits, potential side effects reviewed with patient 3. Recommend supportive treatment with stopping iron sulfate at this point time.  Increase fiber in diet with Metamucil or Citrucel.  We will start empirically on Augmentin for possible diverticulitis.  Patient was advised that if the symptoms worsen or do not improve she should go immediately to the emergency room.  Follow-up with her primary care physician next week.  Is also given a prescription for Zofran for nausea. 4. F/u prn if symptoms worsen or don't improve  Final Clinical Impressions(s) / UC Diagnoses   Final diagnoses:  Abdominal pain, LLQ     Discharge Instructions     If you worsen or not improving within several days go to the emergency room follow-up with your primary care physician    ED Prescriptions    Medication Sig Dispense Auth. Provider   amoxicillin-clavulanate (AUGMENTIN) 875-125 MG tablet Take 1 tablet by mouth every 12 (twelve) hours. 14 tablet Crecencio Mc P, PA-C   ondansetron (ZOFRAN ODT) 8 MG disintegrating tablet Take 1 tablet  (8 mg total) by mouth 2 (two) times daily. 6 tablet Crecencio Mc P, PA-C   naproxen (NAPROSYN) 500 MG tablet Take 1 tablet (500 mg total) by mouth 2 (two) times daily with a meal. 60 tablet Lorin Picket, PA-C     Controlled Substance Prescriptions [place] Controlled Substance Registry consulted? Not Applicable   Lorin Picket, PA-C 04/02/18 2025

## 2018-09-14 IMAGING — MR MR PELVIS W/O CM
4 of 6 series · 15 of 48 positions shown · non-contrast
Comparison: Ultrasound 05/06/2017

CLINICAL DATA: Pelvic pain. Left adnexal mass on recent ultrasound.
Approximately 3 months pregnant.

EXAM:
MRI PELVIS WITHOUT CONTRAST
TECHNIQUE: Multiplanar multisequence MR imaging of the pelvis was performed. No
intravenous contrast was administered.

[Series 2: T2 · coronal · 8.0mm · 1.22mm/px · 3 of 26 slices shown (1 of 2)]
[im 1/26]
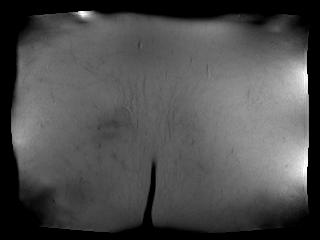
[im 13/26]
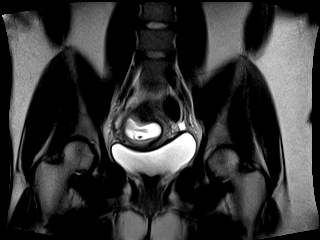
[im 26/26]
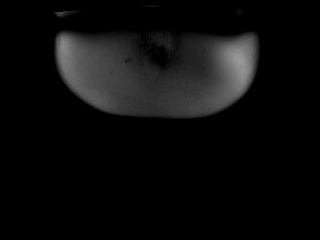

[Series 3: T2 · axial · 5.0mm · 0.57mm/px · z∈[-71,+193]mm · 6 of 45 slices shown (2 of 2)]
[im 1/45]
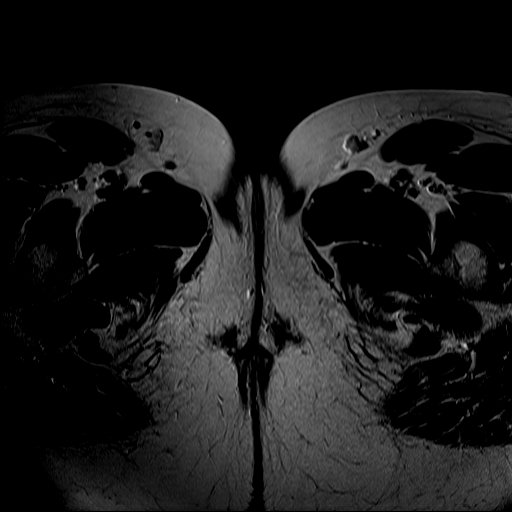
[im 9/45]
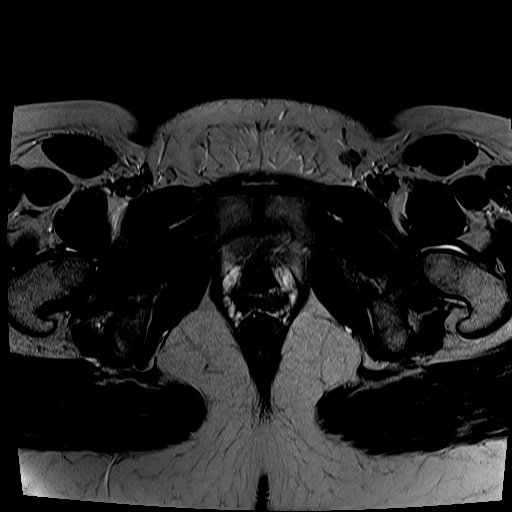
[im 18/45]
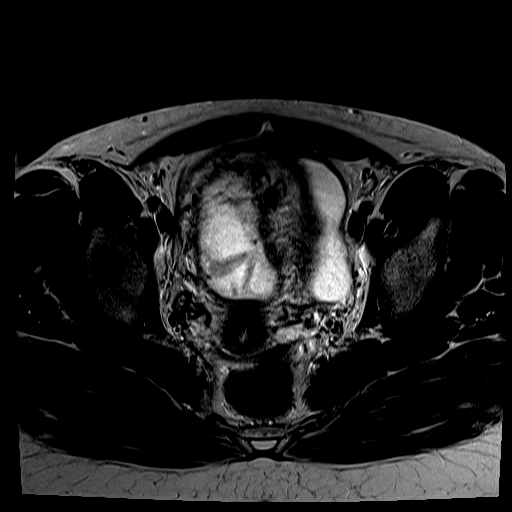
[im 27/45]
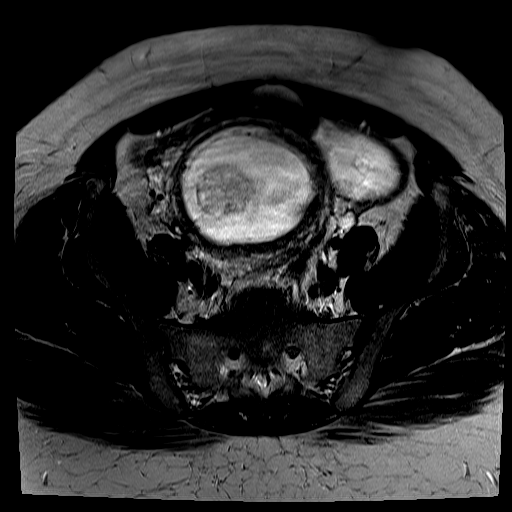
[im 36/45]
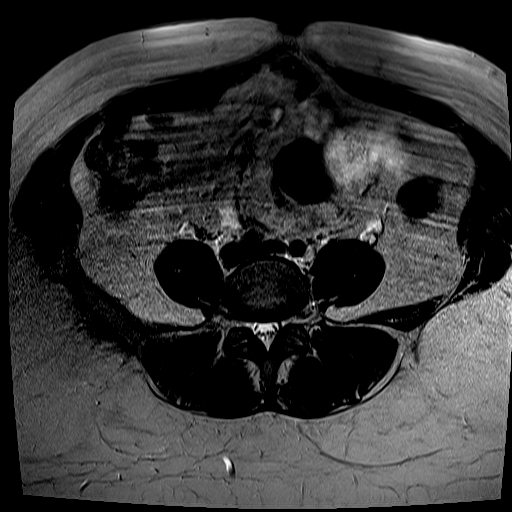
[im 45/45]
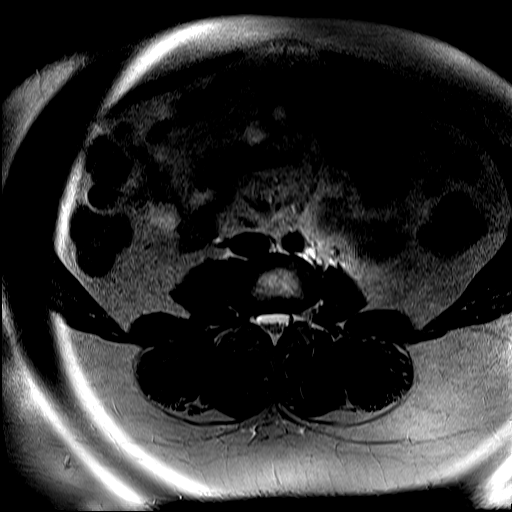

[Series 4: T1 · axial · 5.0mm · 0.38mm/px · z∈[-26,+148]mm · 3 of 88 slices shown]
[im 15/88]
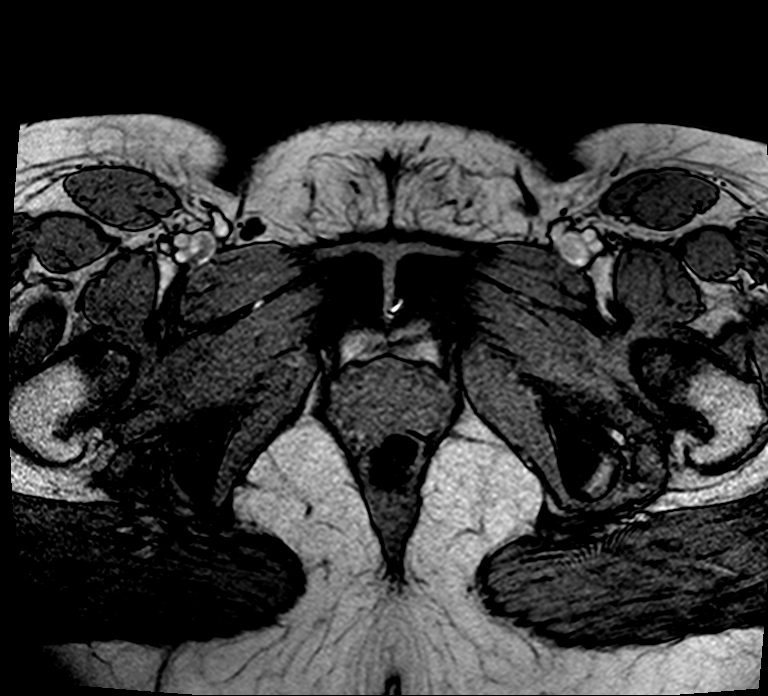
[im 44/88]
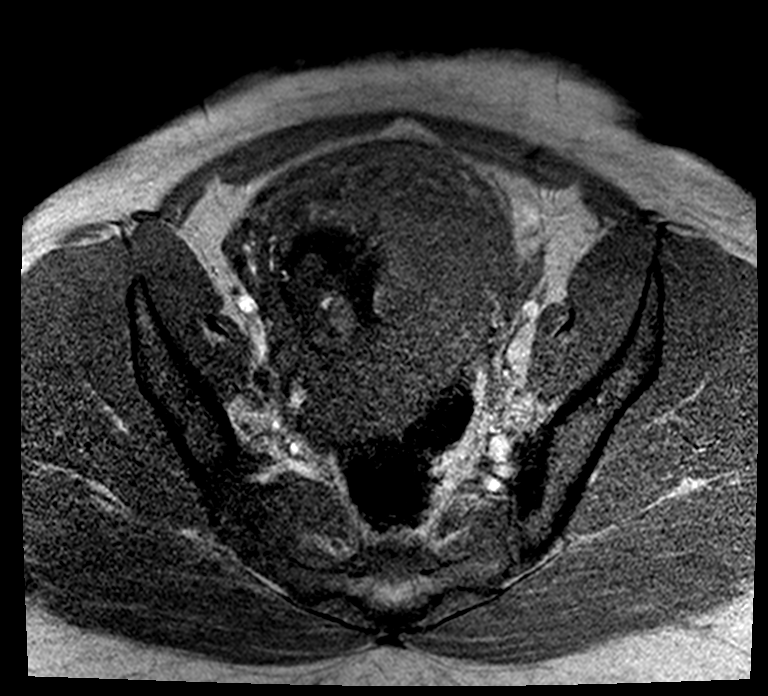
[im 73/88]
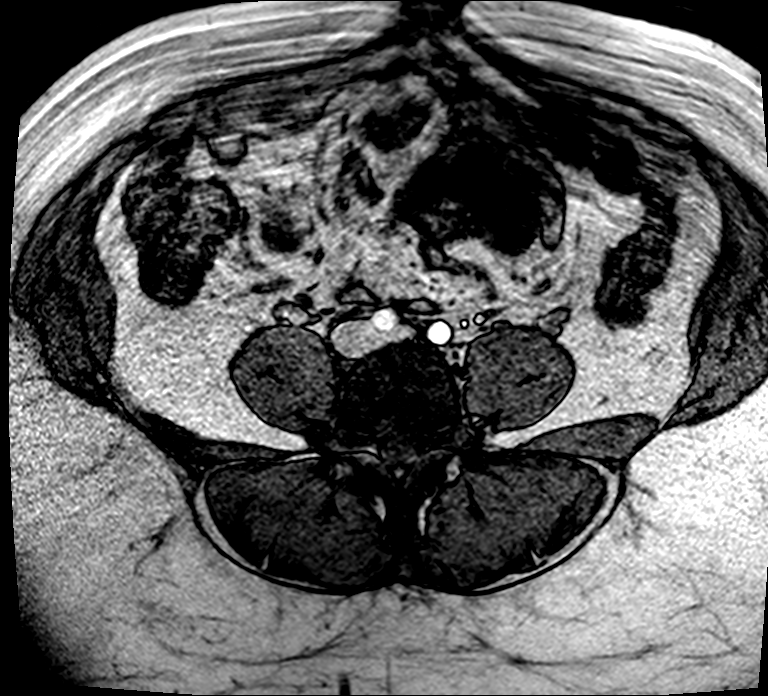

[Series 5: T1 fat-sat · axial · non-contrast · 5.0mm · 0.38mm/px · z∈[-26,+148]mm · 3 of 88 slices shown]
[im 15/88]
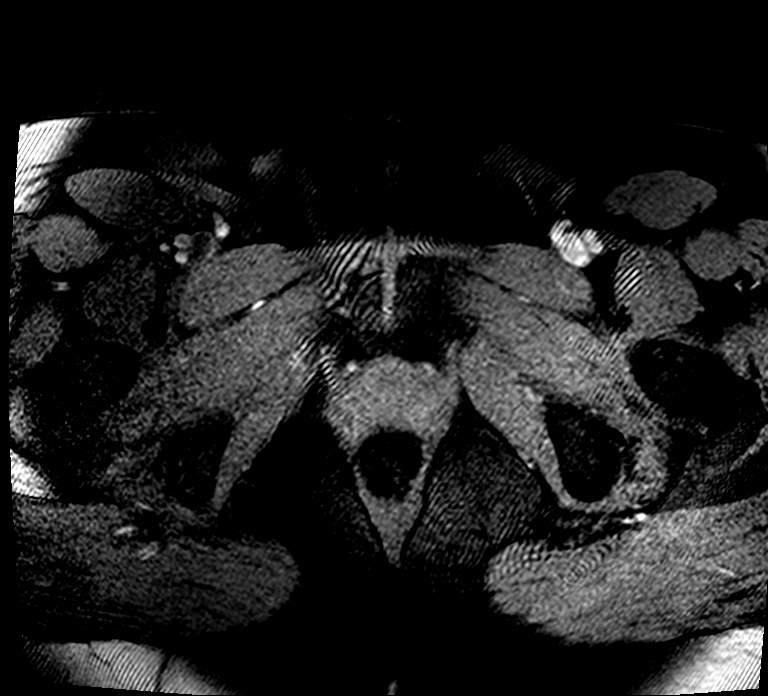
[im 44/88]
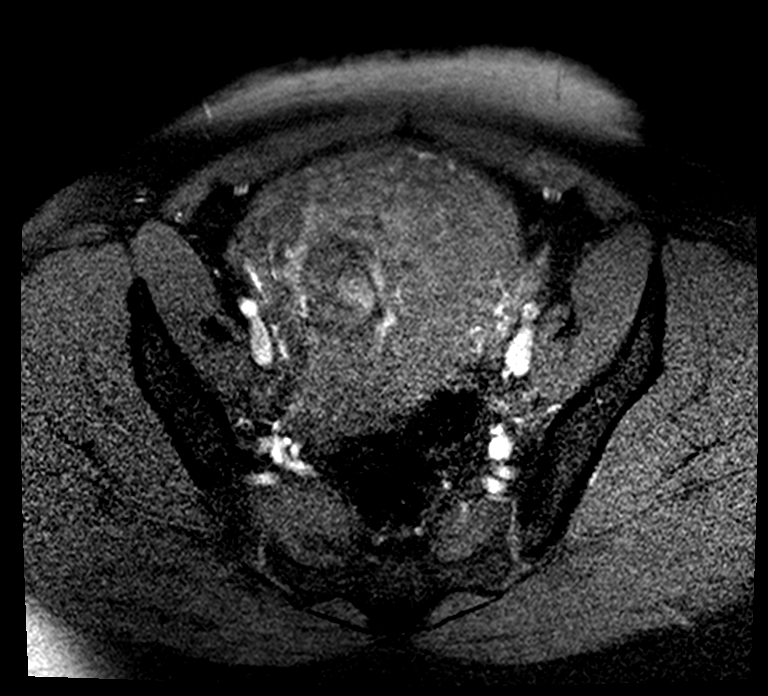
[im 73/88]
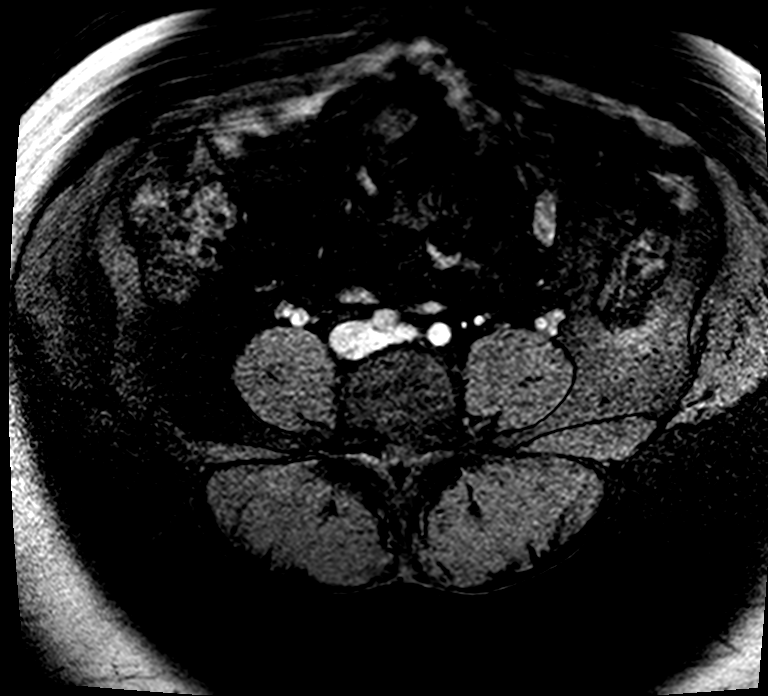

[15 of 48 positions shown; findings below may reference images not displayed]

FINDINGS: Urinary Tract: No urinary bladder or urethral abnormality.

Bowel: Unremarkable pelvic bowel loops.

Vascular/Lymphatic: Unremarkable. No pathologically enlarged pelvic
lymph nodes identified.

Reproductive:

Uterus: Single intrauterine fetus is seen in breech presentation.
Focal myometrial contraction incidentally noted in lower uterine
segment. Anterior placenta, without evidence of previa. Cervix is
long and closed.

Right ovary: Appears normal.  No mass identified.

Left ovary: A complex left ovarian mass is seen which contains foci
of internal fat. This measures 7.7 x 5.7 x 7.2 cm, and is consistent
with a left ovarian dermoid.

Other: No abnormal free fluid.

Musculoskeletal:  Unremarkable.
IMPRESSION: 7.7 cm left ovarian dermoid.

Single intrauterine fetus.

## 2018-10-28 IMAGING — US US OB TRANSVAGINAL
1 series · 13 of 28 positions shown · non-contrast
Comparison: None.

CLINICAL DATA: Pregnant patient in first-trimester pregnancy with
lower abdominal pain and spotting.

EXAM:
OBSTETRIC <14 WK US AND TRANSVAGINAL OB US
TECHNIQUE: Both transabdominal and transvaginal ultrasound examinations were
performed for complete evaluation of the gestation as well as the
maternal uterus, adnexal regions, and pelvic cul-de-sac.
Transvaginal technique was performed to assess early pregnancy.

[Series 1: us ob transvaginal · 0.14mm/px · 13 of 105 slices shown]
[im 4/105]
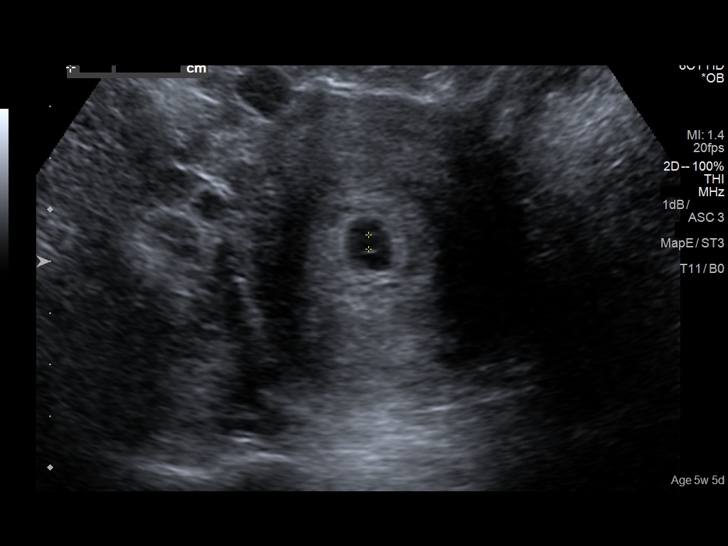
[im 12/105]
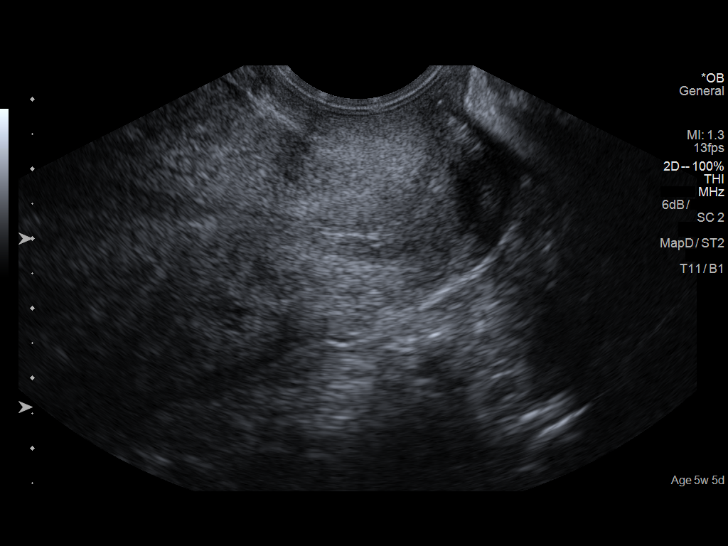
[im 20/105]
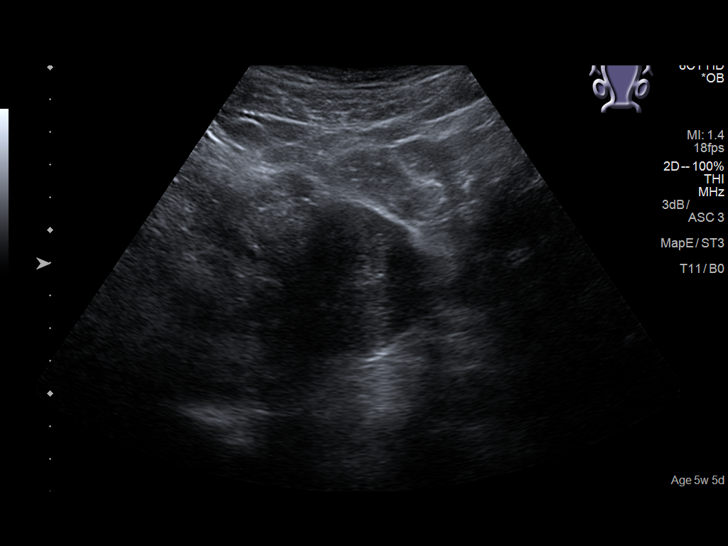
[im 27/105]
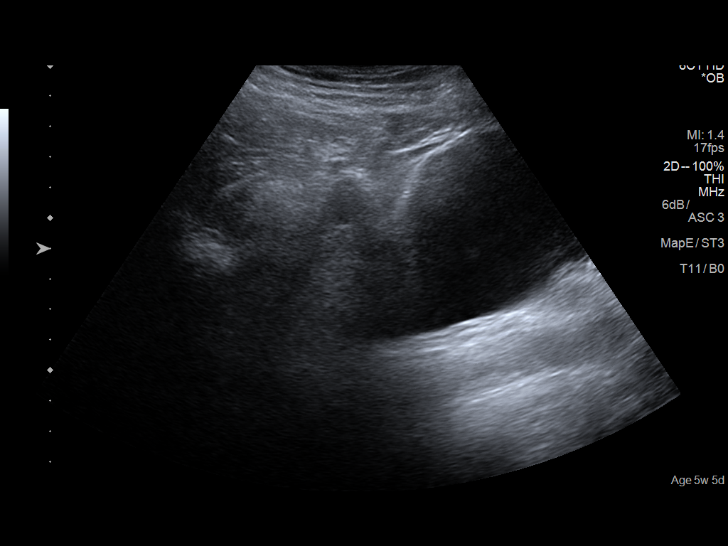
[im 35/105]
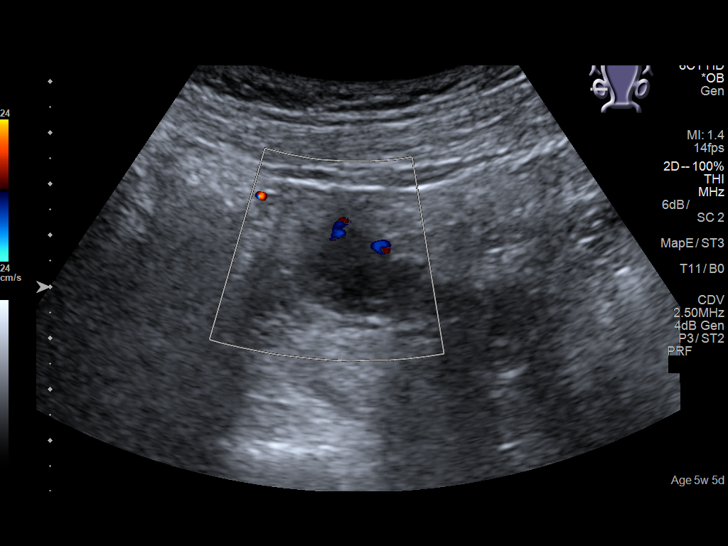
[im 43/105]
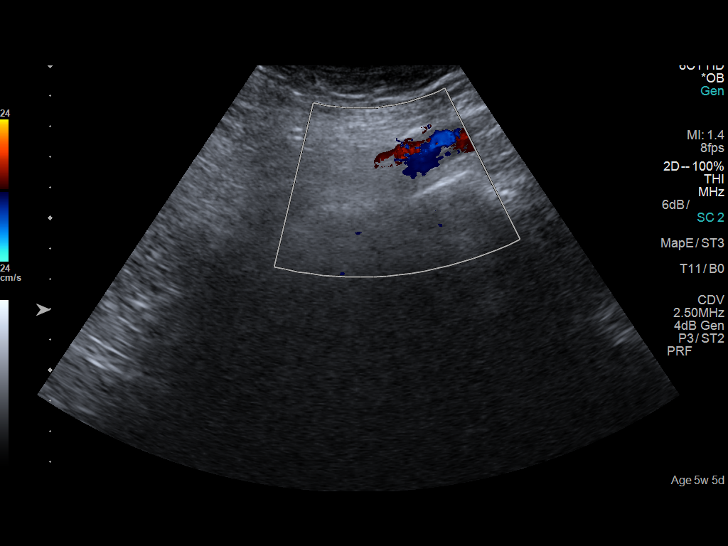
[im 54/105]
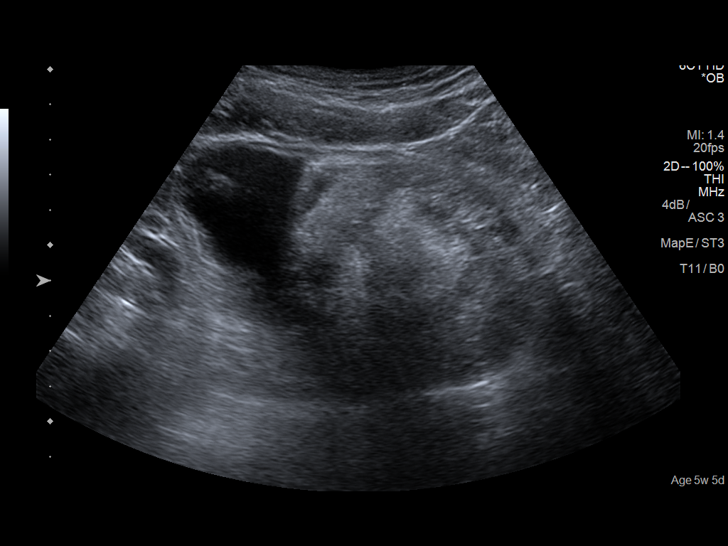
[im 62/105]
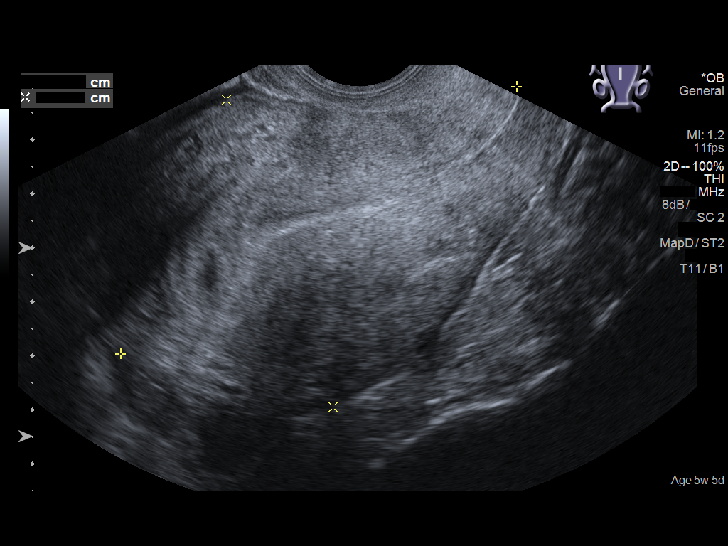
[im 70/105]
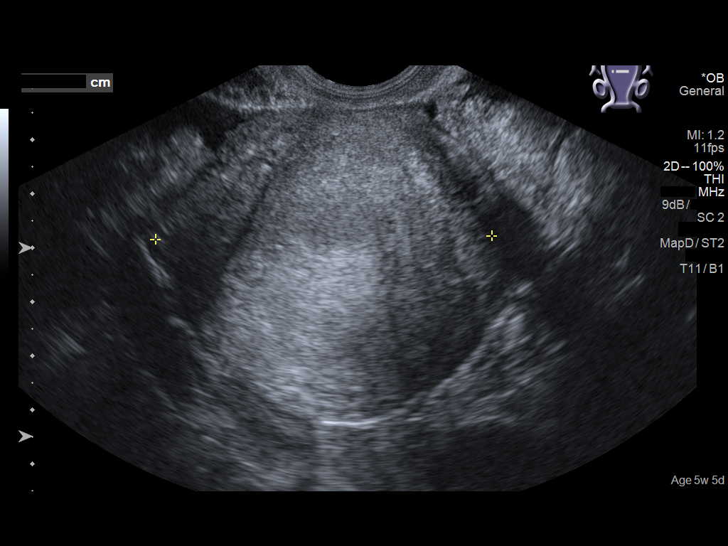
[im 78/105]
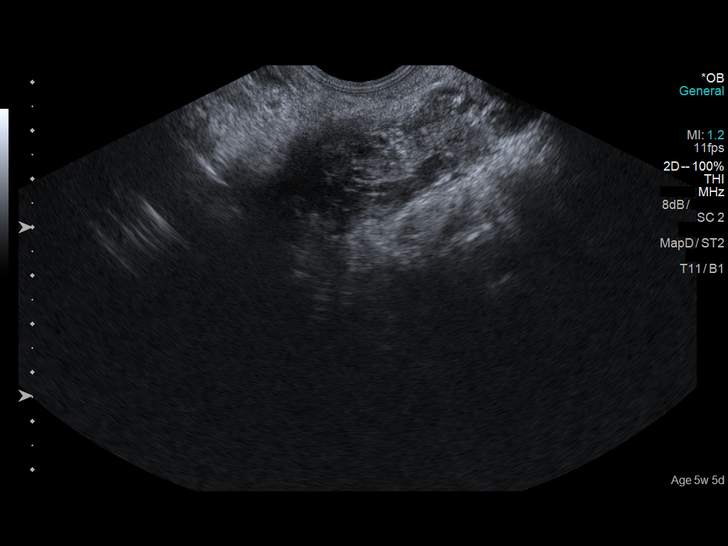
[im 85/105]
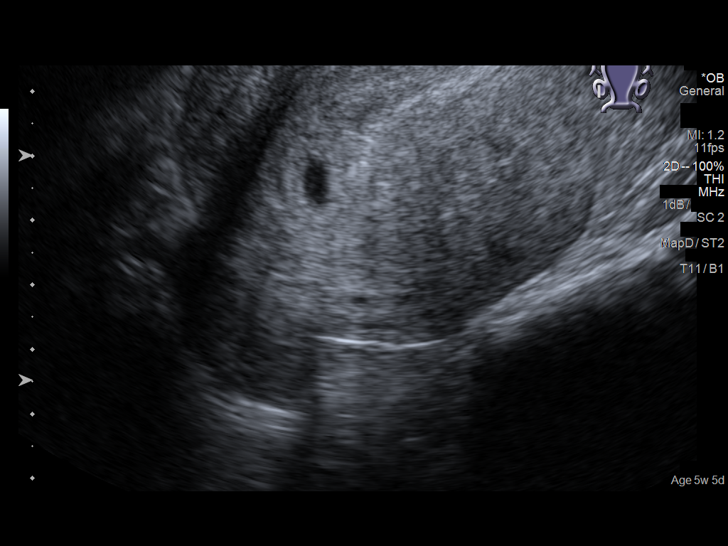
[im 93/105]
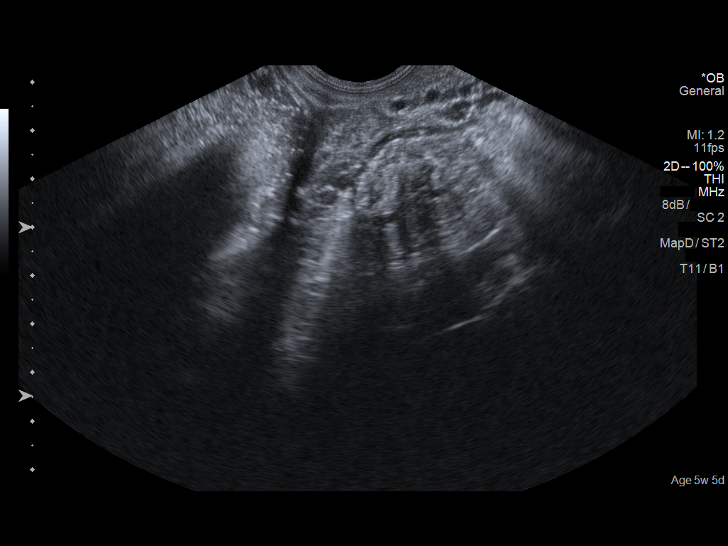
[im 101/105]
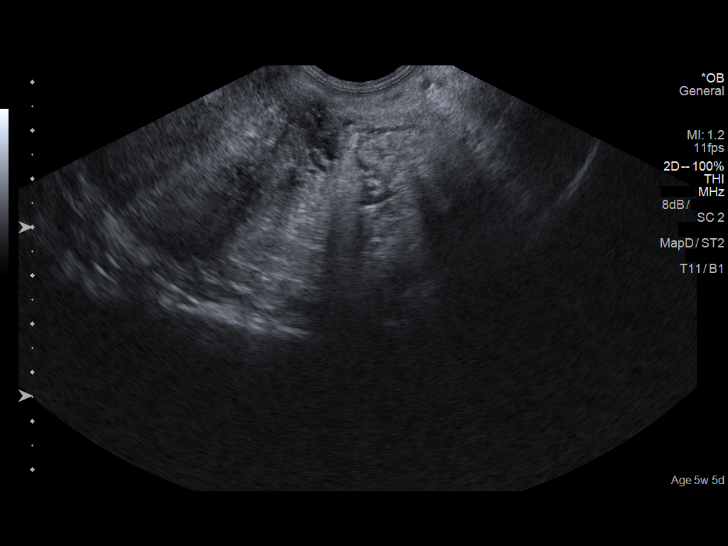

[13 of 28 positions shown; findings below may reference images not displayed]

FINDINGS: Intrauterine gestational sac: Single

Yolk sac:  Visualized.

Embryo:  Not Visualized.

Cardiac Activity: Not Visualized.

MSD: 9  mm   5 w   5  d

Subchorionic hemorrhage:  None visualized.

Maternal uterus/adnexae: The right ovary is visualized and normal.
There is a rounded echogenic structure in the left adnexa measuring
6.5 x 6.8 x 7.1 cm without definite vascularity. Left ovary is not
discretely visualized. Trace pelvic free fluid.
IMPRESSION: 1. Probable early intrauterine gestational sac with a yolk sac, but
no fetal pole or cardiac activity yet visualized. Recommend
follow-up quantitative B-HCG levels and follow-up US in 10-14 days
to assess viability. This recommendation follows SRU consensus
guidelines: Diagnostic Criteria for Nonviable Pregnancy Early in the
First Trimester. N Engl J Med 6587; [DATE].
2. Round heterogeneous 7 cm echogenic structure in the left adnexa,
with nonvisualization of left ovary. Differential considerations
include left ovarian mass versus non peristalsing bowel. This can be
re-assessed on follow-up ultrasound.

## 2018-11-05 IMAGING — US US OB COMP LESS 14 WK
1 series · 13 of 28 positions shown · non-contrast
Comparison: Ultrasound March 18, 2017.

CLINICAL DATA: Threatened abortion.

EXAM:
OBSTETRIC <14 WK US AND TRANSVAGINAL OB US
TECHNIQUE: Both transabdominal and transvaginal ultrasound examinations were
performed for complete evaluation of the gestation as well as the
maternal uterus, adnexal regions, and pelvic cul-de-sac.
Transvaginal technique was performed to assess early pregnancy.

[Series 1: us ob comp less 14 wk · 0.14mm/px · 13 of 122 slices shown]
[im 5/122]
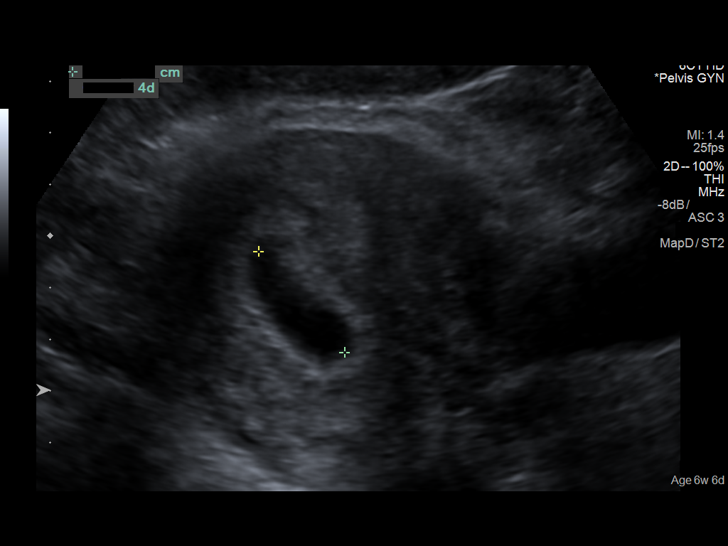
[im 14/122]
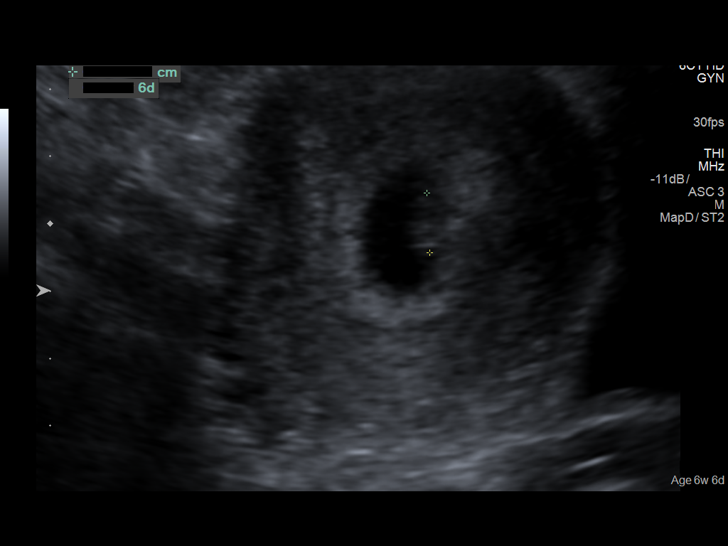
[im 23/122]
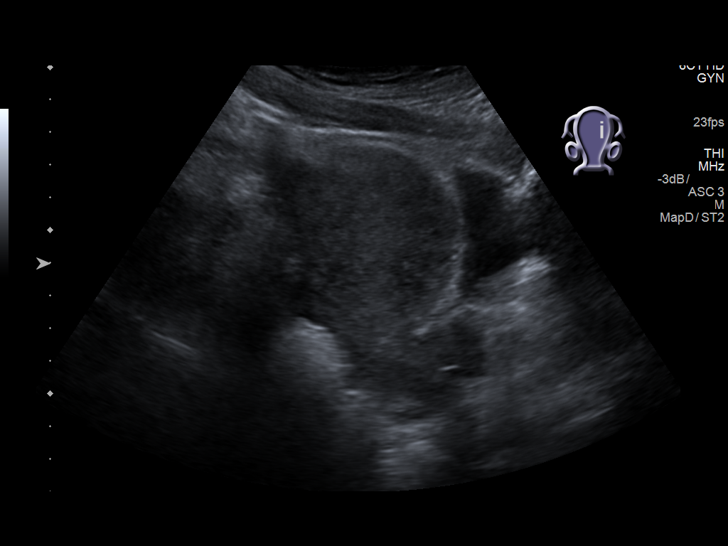
[im 32/122]
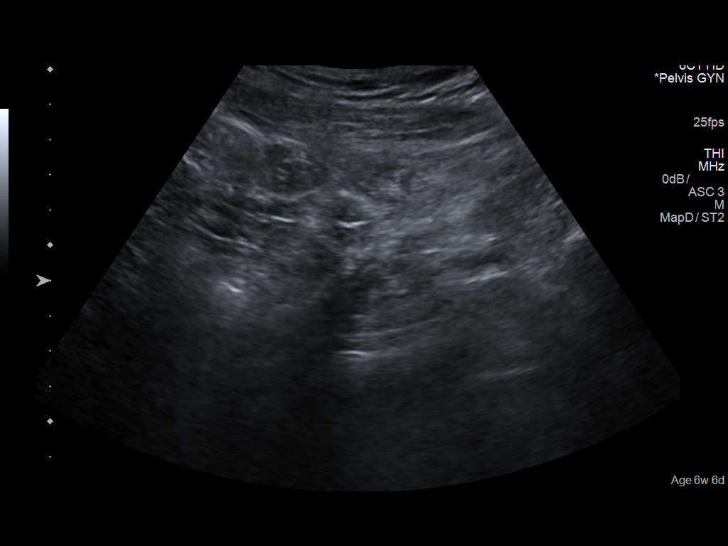
[im 41/122]
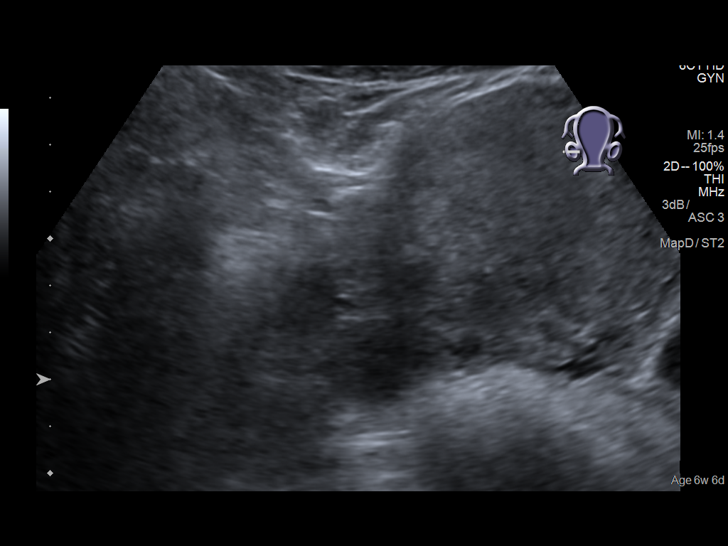
[im 50/122]
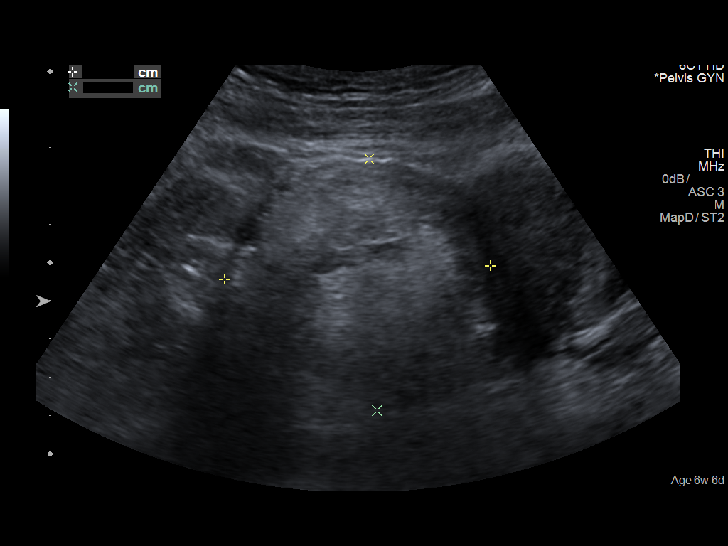
[im 63/122]
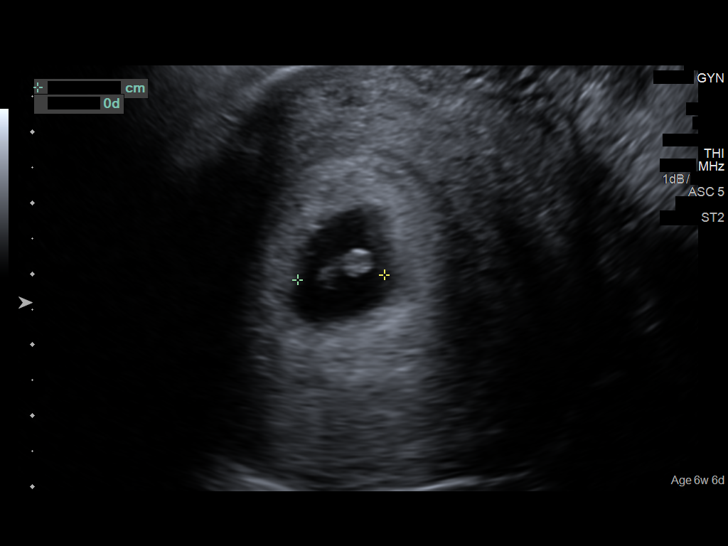
[im 72/122]
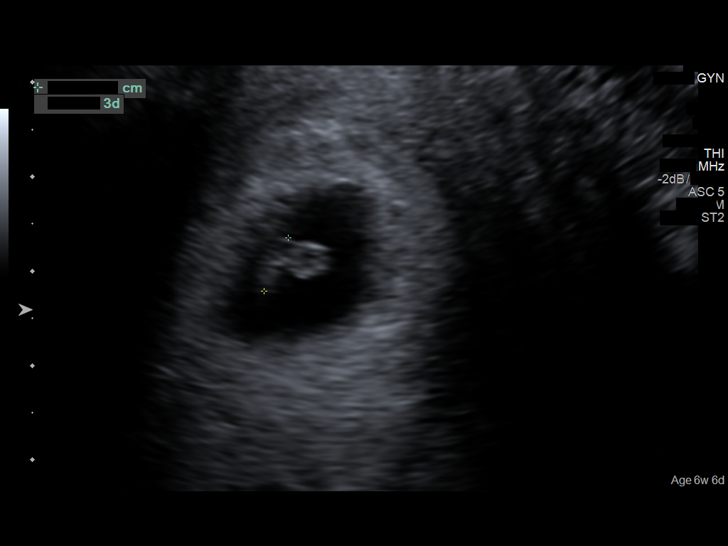
[im 81/122]
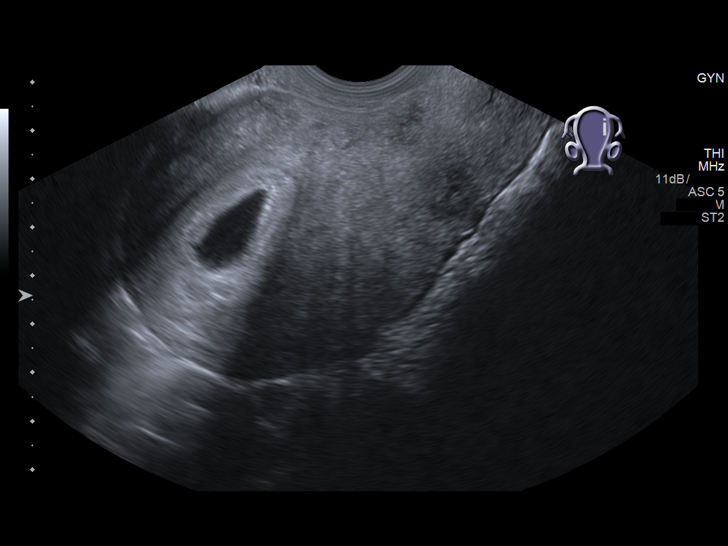
[im 90/122]
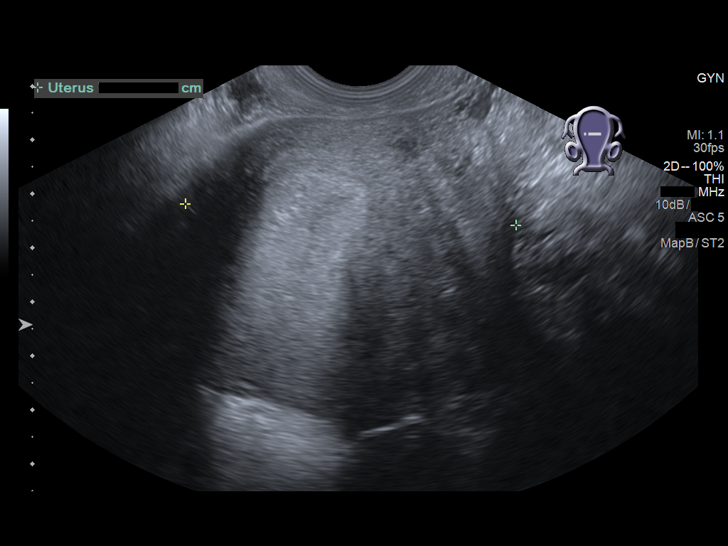
[im 99/122]
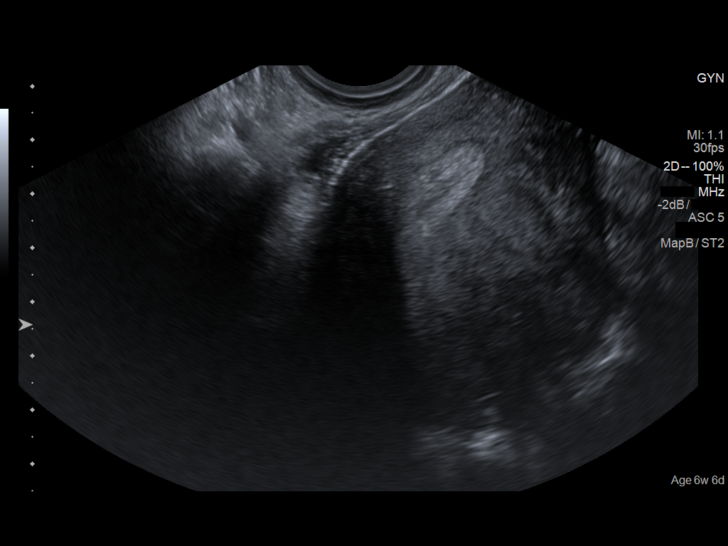
[im 108/122]
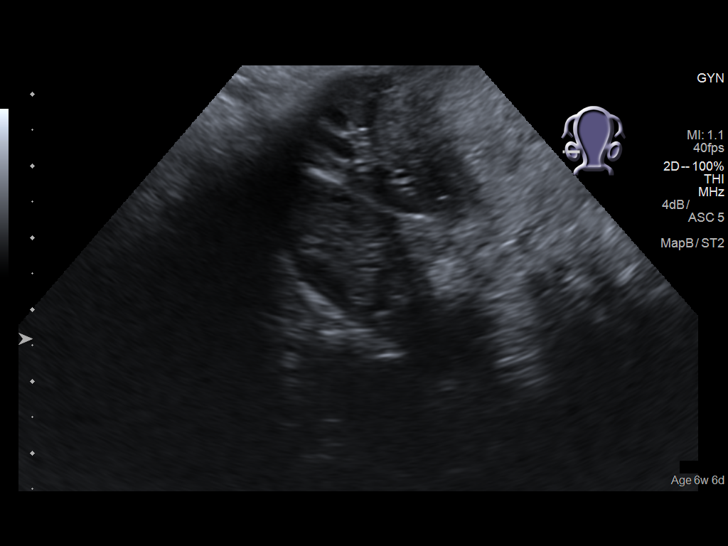
[im 117/122]
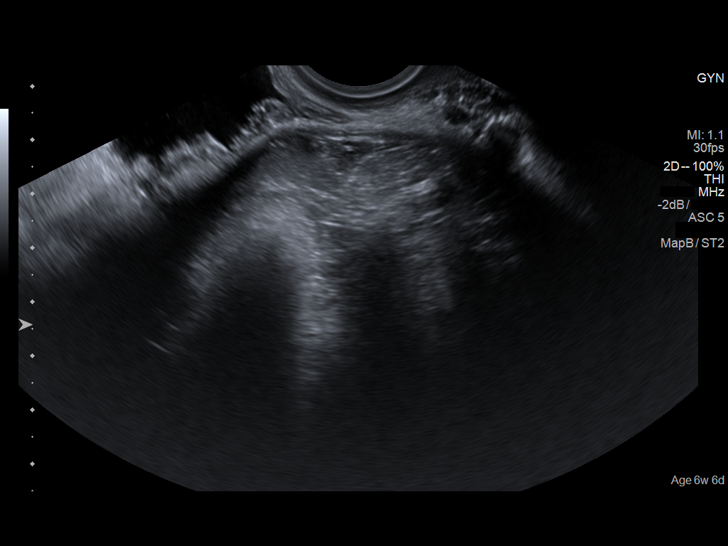

[13 of 28 positions shown; findings below may reference images not displayed]

FINDINGS: Intrauterine gestational sac: Single.

Yolk sac:  Visualized.

Embryo:  Visualized.

Cardiac Activity: Visualized.

Heart Rate: 114  bpm

CRL:  7.5  mm   6 w   5 d                  US EDC: November 14, 2017.

Subchorionic hemorrhage:  None visualized.

Maternal uterus/adnexae: Trace free fluid is noted which may be
physiologic. 2.2 x 1.5 x 1.5 cm complex lesion is noted in the right
ovary which may represent corpus luteum cyst. 7 cm predominantly
solid mass is again noted in the left adnexal region which is stable
compared to prior exam. Given the lack of change, this is concerning
for solid ovarian neoplasm.
IMPRESSION: Single live intrauterine gestation of 6 weeks 5 days. Probable
corpus luteum cyst seen in right ovary.

Stable 7 cm predominantly solid mass seen in left adnexal region.
While this may represent non peristalsing bowel as mentioned on
prior exam, it is felt that this is less likely given the lack of
change. While this may represent an abnormally large left ovary, it
is concerning for left ovarian neoplasm. Consultation with
gynecology is recommended as well as continued ultrasound follow-up.

## 2019-02-10 ENCOUNTER — Telehealth: Payer: Medicaid Other | Admitting: Family

## 2019-02-10 DIAGNOSIS — Z20822 Contact with and (suspected) exposure to covid-19: Secondary | ICD-10-CM

## 2019-02-10 DIAGNOSIS — Z20828 Contact with and (suspected) exposure to other viral communicable diseases: Secondary | ICD-10-CM

## 2019-02-10 DIAGNOSIS — R05 Cough: Secondary | ICD-10-CM

## 2019-02-10 DIAGNOSIS — R059 Cough, unspecified: Secondary | ICD-10-CM

## 2019-02-10 MED ORDER — BENZONATATE 100 MG PO CAPS: 100.0000 mg | ORAL_CAPSULE | Freq: Three times a day (TID) | ORAL | 0 refills | Status: AC | PRN

## 2019-02-10 NOTE — Progress Notes (Signed)
E-Visit for Corona Virus Screening   Your current symptoms could be consistent with the coronavirus.  I have routed this to our Community testing pool, they should contact you to set up a time for testing.   Approximately 5 minutes was spent documenting and reviewing patient's chart.   COVID-19 is a respiratory illness with symptoms that are similar to the flu. Symptoms are typically mild to moderate, but there have been cases of severe illness and death due to the virus. The following symptoms may appear 2-14 days after exposure: . Fever . Cough . Shortness of breath or difficulty breathing . Chills . Repeated shaking with chills . Muscle pain . Headache . Sore throat . New loss of taste or smell . Fatigue . Congestion or runny nose . Nausea or vomiting . Diarrhea  It is vitally important that if you feel that you have an infection such as this virus or any other virus that you stay home and away from places where you may spread it to others.  You should self-quarantine for 14 days if you have symptoms that could potentially be coronavirus or have been in close contact a with a person diagnosed with COVID-19 within the last 2 weeks. You should avoid contact with people age 33 and older.   You should wear a mask or cloth face covering over your nose and mouth if you must be around other people or animals, including pets (even at home). Try to stay at least 6 feet away from other people. This will protect the people around you.  You can use medication such as A prescription cough medication called Tessalon Perles 100 mg. You may take 1-2 capsules every 8 hours as needed for cough  You may also take acetaminophen (Tylenol) as needed for fever.   Reduce your risk of any infection by using the same precautions used for avoiding the common cold or flu:  Marland Kitchen Wash your hands often with soap and warm water for at least 20 seconds.  If soap and water are not readily available, use an  alcohol-based hand sanitizer with at least 60% alcohol.  . If coughing or sneezing, cover your mouth and nose by coughing or sneezing into the elbow areas of your shirt or coat, into a tissue or into your sleeve (not your hands). . Avoid shaking hands with others and consider head nods or verbal greetings only. . Avoid touching your eyes, nose, or mouth with unwashed hands.  . Avoid close contact with people who are sick. . Avoid places or events with large numbers of people in one location, like concerts or sporting events. . Carefully consider travel plans you have or are making. . If you are planning any travel outside or inside the [place], visit the CDC's Travelers' Health webpage for the latest health notices. . If you have some symptoms but not all symptoms, continue to monitor at home and seek medical attention if your symptoms worsen. . If you are having a medical emergency, call 911.  HOME CARE . Only take medications as instructed by your medical team. . Drink plenty of fluids and get plenty of rest. . A steam or ultrasonic humidifier can help if you have congestion.   GET HELP RIGHT AWAY IF YOU HAVE EMERGENCY WARNING SIGNS** FOR COVID-19. If you or someone is showing any of these signs seek emergency medical care immediately. Call 911 or proceed to your closest emergency facility if: . You develop worsening high fever. . Trouble breathing .  Bluish lips or face . Persistent pain or pressure in the chest . New confusion . Inability to wake or stay awake . You cough up blood. . Your symptoms become more severe  **This list is not all possible symptoms. Contact your medical provider for any symptoms that are sever or concerning to you.   MAKE SURE YOU   Understand these instructions.  Will watch your condition.  Will get help right away if you are not doing well or get worse.  Your e-visit answers were reviewed by a board certified advanced clinical practitioner to complete  your personal care plan.  Depending on the condition, your plan could have included both over the counter or prescription medications.  If there is a problem please reply once you have received a response from your provider.  Your safety is important to [place].  If you have drug allergies check your prescription carefully.    You can use MyChart to ask questions about today's visit, request a non-urgent call back, or ask for a work or school excuse for 24 hours related to this e-Visit. If it has been greater than 24 hours you will need to follow up with your provider, or enter a new e-Visit to address those concerns. You will get an e-mail in the next two days asking about your experience.  I hope that your e-visit has been valuable and will speed your recovery. Thank you for using e-visits.

## 2021-05-18 ENCOUNTER — Emergency Department: Payer: Managed Care, Other (non HMO)

## 2021-05-18 ENCOUNTER — Other Ambulatory Visit: Payer: Self-pay

## 2021-05-18 ENCOUNTER — Emergency Department
Admission: EM | Admit: 2021-05-18 | Discharge: 2021-05-18 | Disposition: A | Discharge status: Home / Self Care | Payer: Managed Care, Other (non HMO) | Attending: Emergency Medicine | Admitting: Emergency Medicine

## 2021-05-18 DIAGNOSIS — I10 Essential (primary) hypertension: Secondary | ICD-10-CM | POA: Diagnosis not present

## 2021-05-18 DIAGNOSIS — Z79899 Other long term (current) drug therapy: Secondary | ICD-10-CM | POA: Insufficient documentation

## 2021-05-18 DIAGNOSIS — F17290 Nicotine dependence, other tobacco product, uncomplicated: Secondary | ICD-10-CM | POA: Diagnosis not present

## 2021-05-18 DIAGNOSIS — R0789 Other chest pain: Secondary | ICD-10-CM | POA: Insufficient documentation

## 2021-05-18 DIAGNOSIS — Y92410 Unspecified street and highway as the place of occurrence of the external cause: Secondary | ICD-10-CM | POA: Diagnosis not present

## 2021-05-18 DIAGNOSIS — S161XXA Strain of muscle, fascia and tendon at neck level, initial encounter: Secondary | ICD-10-CM | POA: Insufficient documentation

## 2021-05-18 DIAGNOSIS — S199XXA Unspecified injury of neck, initial encounter: Secondary | ICD-10-CM | POA: Diagnosis present

## 2021-05-18 MED ORDER — METHOCARBAMOL 500 MG PO TABS: 500.0000 mg | ORAL_TABLET | Freq: Four times a day (QID) | ORAL | 0 refills | Status: AC

## 2021-05-18 MED ORDER — NAPROXEN 500 MG PO TABS: 500.0000 mg | ORAL_TABLET | Freq: Two times a day (BID) | ORAL | 0 refills | Status: AC
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
## 2021-05-18 NOTE — Discharge Instructions (Signed)
Follow up with primary care for symptoms that are not improving with medication and rest.  Return to the ER for symptoms that change or worsen if unable to schedule an appointment.
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
## 2021-10-24 ENCOUNTER — Other Ambulatory Visit: Payer: Self-pay

## 2021-10-24 ENCOUNTER — Ambulatory Visit
Admission: RE | Admit: 2021-10-24 | Discharge: 2021-10-24 | Disposition: A | Discharge status: Home / Self Care | Payer: Managed Care, Other (non HMO) | Source: Ambulatory Visit | Attending: Family Medicine | Admitting: Family Medicine

## 2021-10-24 ENCOUNTER — Other Ambulatory Visit: Payer: Self-pay | Admitting: Family Medicine

## 2021-10-24 DIAGNOSIS — M79662 Pain in left lower leg: Secondary | ICD-10-CM | POA: Diagnosis present

## 2023-01-13 ENCOUNTER — Other Ambulatory Visit: Payer: Self-pay | Admitting: Family Medicine

## 2023-01-13 DIAGNOSIS — N838 Other noninflammatory disorders of ovary, fallopian tube and broad ligament: Secondary | ICD-10-CM

## 2023-01-15 ENCOUNTER — Ambulatory Visit: Payer: Managed Care, Other (non HMO)

## 2023-02-12 ENCOUNTER — Ambulatory Visit
Admission: RE | Admit: 2023-02-12 | Discharge: 2023-02-12 | Disposition: A | Discharge status: Home / Self Care | Payer: Managed Care, Other (non HMO) | Source: Ambulatory Visit | Attending: Family Medicine | Admitting: Family Medicine

## 2023-02-12 DIAGNOSIS — N838 Other noninflammatory disorders of ovary, fallopian tube and broad ligament: Secondary | ICD-10-CM | POA: Insufficient documentation

## 2023-02-12 MED ORDER — GADOBUTROL 1 MMOL/ML IV SOLN
10.0000 mL | Freq: Once | INTRAVENOUS | Status: AC | PRN
  Administered 2023-02-12: 10 mL via INTRAVENOUS

## 2023-08-27 ENCOUNTER — Encounter: Payer: Self-pay | Admitting: Obstetrics and Gynecology

## 2023-08-27 ENCOUNTER — Ambulatory Visit: Payer: Managed Care, Other (non HMO) | Admitting: Obstetrics and Gynecology

## 2023-08-27 ENCOUNTER — Other Ambulatory Visit: Payer: Self-pay | Admitting: Obstetrics and Gynecology

## 2023-08-27 ENCOUNTER — Other Ambulatory Visit: Payer: Managed Care, Other (non HMO)

## 2023-08-27 VITALS — BP 130/80 | HR 89 | Ht 68.0 in | Wt 192.8 lb

## 2023-08-27 DIAGNOSIS — D271 Benign neoplasm of left ovary: Secondary | ICD-10-CM | POA: Diagnosis not present

## 2023-08-27 DIAGNOSIS — N83209 Unspecified ovarian cyst, unspecified side: Secondary | ICD-10-CM

## 2023-08-27 DIAGNOSIS — N83299 Other ovarian cyst, unspecified side: Secondary | ICD-10-CM

## 2023-08-27 DIAGNOSIS — D369 Benign neoplasm, unspecified site: Secondary | ICD-10-CM

## 2023-08-27 NOTE — Progress Notes (Signed)
HPI:      Joanne Shannon is a 38 y.o. [id number] who LMP was Patient's last menstrual period was 08/26/2023 (exact date).  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 At that time she also was noted to have a complex appearing right-sided ovarian cyst. She has occasional pain but rare. She is relatively certain that she has completed childbearing. She is not currently using anything for birth control but is having limited intercourse.    Hx: The following portions of the patient's history were reviewed and updated as appropriate:             She  has a past medical history of Anxiety, Enlarged thyroid, and Hypertension. She does not have any pertinent problems on file. She  has a past surgical history that includes No past surgeries. Her family history includes Diabetes in her sister; Luiz Blare' disease in her father. She  reports that she has been smoking cigars. She has never used smokeless tobacco. She reports that she does not drink alcohol and does not use drugs. She has a current medication list which includes the following prescription(s): cetirizine hcl, ibuprofen, polyvinyl alcohol-povidone, vitamin d (ergocalciferol), amlodipine, amoxicillin-clavulanate, benzonatate, chlorthalidone, docusate sodium, methocarbamol, naproxen, and ondansetron. She has no known allergies.       Review of Systems:  Review of Systems  Constitutional: Denied constitutional symptoms, night sweats, recent illness, fatigue, fever, insomnia and weight loss.  Eyes: Denied eye symptoms, eye pain, photophobia, vision change and visual disturbance.  Ears/Nose/Throat/Neck: Denied ear, nose, throat or neck symptoms, hearing loss, nasal discharge, sinus congestion and sore throat.  Cardiovascular: Denied cardiovascular symptoms, arrhythmia, chest pain/pressure, edema, exercise intolerance, orthopnea and palpitations.  Respiratory: Denied  pulmonary symptoms, asthma, pleuritic pain, productive sputum, cough, dyspnea and wheezing.  Gastrointestinal: Denied, gastro-esophageal reflux, melena, nausea and vomiting.  Genitourinary: See HPI for additional information.  Musculoskeletal: Denied musculoskeletal symptoms, stiffness, swelling, muscle weakness and myalgia.  Dermatologic: Denied dermatology symptoms, rash and scar.  Neurologic: Denied neurology symptoms, dizziness, headache, neck pain and syncope.  Psychiatric: Denied psychiatric symptoms, anxiety and depression.  Endocrine: Denied endocrine symptoms including hot flashes and night sweats.   Meds:   Current Outpatient Medications on File Prior to Visit  Medication Sig Dispense Refill   Cetirizine HCl (ZYRTEC PO) Take by mouth.     ibuprofen (ADVIL,MOTRIN) 800 MG tablet Take 1 tablet (800 mg total) by mouth every 8 (eight) hours as needed. 60 tablet 1   Polyvinyl Alcohol-Povidone (CLEAR EYES ALL SEASONS) 5-6 MG/ML SOLN Apply to eye.     Vitamin D, Ergocalciferol, (DRISDOL) 1.25 MG (50000 UNIT) CAPS capsule Take 50,000 Units by mouth once a week.     amLODipine (NORVASC) 5 MG tablet Take 5 mg by mouth daily.     amoxicillin-clavulanate (AUGMENTIN) 875-125 MG tablet Take 1 tablet by mouth every 12 (twelve) hours. (Patient not taking: Reported on 08/27/2023) 14 tablet 0   benzonatate (TESSALON PERLES) 100 MG capsule Take 1 capsule (100 mg total) by mouth 3 (three) times daily as needed. (Patient not taking: Reported on 08/27/2023) 20 capsule 0   chlorthalidone (HYGROTON) 25 MG tablet Take 1 tablet by mouth daily.     docusate sodium (COLACE) 100 MG capsule Take 1 capsule (100 mg total) by mouth 2 (two) times daily as needed. (Patient not taking: Reported on 08/27/2023) 30 capsule 2   methocarbamol (ROBAXIN) 500 MG tablet Take 1 tablet (500  mg total) by mouth 4 (four) times daily. (Patient not taking: Reported on 08/27/2023) 30 tablet 0   naproxen (NAPROSYN) 500 MG tablet Take 1  tablet (500 mg total) by mouth 2 (two) times daily with a meal. (Patient not taking: Reported on 08/27/2023) 30 tablet 0   ondansetron (ZOFRAN ODT) 8 MG disintegrating tablet Take 1 tablet (8 mg total) by mouth 2 (two) times daily. (Patient not taking: Reported on 08/27/2023) 6 tablet 0   No current facility-administered medications on file prior to visit.      Objective:     Vitals:   08/27/23 0958  BP: 130/80  Pulse: 89   Filed Weights   08/27/23 0958  Weight: 192 lb 12.8 oz (87.5 kg)              Imaging results reviewed in detail with the patient.          Assessment:    [id number] Patient Active Problem List   Diagnosis Date Noted   Encounter for induction of labor 11/21/2017   Cramping affecting pregnancy, antepartum 11/20/2017   Poor weight gain of pregnancy, third trimester 08/21/2017   Goiter 05/03/2017   Obesity (BMI 30-39.9) 05/03/2017   Threatened abortion 03/21/2017   Adnexal mass 03/21/2017     1. Ovarian teratoma, left   2. Complex ovarian cyst     She is relatively asymptomatic with these ovarian cysts.  She would like to know her options regarding possible surgery.   Plan:            1.  We have discussed her emergent findings in detail.   The possibility of laparoscopic left oophorectomy was discussed in some detail.  Risk and benefits discussed.  We have also discussed the possibility of hysterectomy at the time of oophorectomy.  The low risk of malignancy of the right ovary discussed but will plan follow-up to see if that complex cyst remains.  Will consider Roma scoring.   She is to consider all of her options after ultrasound and Roma scoring returns.  We will then meet for preop discussion if she so desires.  All questions answered.   2.  Ultrasound ordered. Orders No orders of the defined types were placed in this encounter.   No orders of the defined types were placed in this encounter.     F/U  Return for We will contact her with any  abnormal test results.  Elonda Husky, M.D. 08/27/2023 11:53 AM

## 2023-08-28 ENCOUNTER — Other Ambulatory Visit: Payer: Self-pay

## 2023-08-28 DIAGNOSIS — N83299 Other ovarian cyst, unspecified side: Secondary | ICD-10-CM

## 2023-08-28 DIAGNOSIS — D271 Benign neoplasm of left ovary: Secondary | ICD-10-CM

## 2023-08-28 NOTE — Progress Notes (Signed)
Roma testing ordered. 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 Patient has chosen for [facility] to result her labs.

## 2023-08-28 NOTE — Progress Notes (Signed)
Spoke with patient about [facility] lab pricing for ROMA score, patient states she will use [facility] now.

## 2023-09-03 ENCOUNTER — Other Ambulatory Visit: Payer: Managed Care, Other (non HMO)

## 2023-09-03 DIAGNOSIS — N83299 Other ovarian cyst, unspecified side: Secondary | ICD-10-CM

## 2023-09-03 DIAGNOSIS — D271 Benign neoplasm of left ovary: Secondary | ICD-10-CM

## 2023-09-04 LAB — OVARIAN MALIGNANCY RISK-ROMA
Cancer Antigen (CA) 125: 2.6 U/mL (ref 0.0–38.1)
HE4: 42.6 pmol/L (ref 0.0–61.2)
Postmenopausal ROMA: 0.3
Premenopausal ROMA: 0.47

## 2023-09-04 LAB — PREMENOPAUSAL INTERP: LOW

## 2023-09-04 LAB — POSTMENOPAUSAL INTERP: LOW

## 2023-09-09 ENCOUNTER — Other Ambulatory Visit: Payer: Self-pay

## 2023-09-09 DIAGNOSIS — D271 Benign neoplasm of left ovary: Secondary | ICD-10-CM

## 2023-09-09 DIAGNOSIS — N83299 Other ovarian cyst, unspecified side: Secondary | ICD-10-CM

## 2023-09-11 ENCOUNTER — Ambulatory Visit
Admission: RE | Admit: 2023-09-11 | Discharge: 2023-09-11 | Disposition: A | Discharge status: Home / Self Care | Payer: Managed Care, Other (non HMO) | Source: Ambulatory Visit | Attending: Obstetrics and Gynecology | Admitting: Obstetrics and Gynecology

## 2023-09-11 DIAGNOSIS — N83299 Other ovarian cyst, unspecified side: Secondary | ICD-10-CM | POA: Insufficient documentation

## 2023-09-11 DIAGNOSIS — D271 Benign neoplasm of left ovary: Secondary | ICD-10-CM | POA: Insufficient documentation

## 2023-09-23 ENCOUNTER — Encounter: Payer: Self-pay | Admitting: Obstetrics and Gynecology

## 2023-09-23 ENCOUNTER — Telehealth (INDEPENDENT_AMBULATORY_CARE_PROVIDER_SITE_OTHER): Payer: Managed Care, Other (non HMO) | Admitting: Obstetrics and Gynecology

## 2023-09-23 DIAGNOSIS — D271 Benign neoplasm of left ovary: Secondary | ICD-10-CM

## 2023-09-23 DIAGNOSIS — N83299 Other ovarian cyst, unspecified side: Secondary | ICD-10-CM

## 2023-09-23 NOTE — Progress Notes (Signed)
Virtual Visit via Video Note  I connected with Joanne Shannon on 09/23/23 at  7:35 AM EST by video and verified that I was speaking with the correct person using two identifiers.    [name] is a 38 y.o. [id number] who LMP was Patient's last menstrual period was 08/26/2023 (exact date). I discussed the limitations, risks, security and privacy concerns of performing an evaluation and management service by video and the availability of in person appointments. I also discussed with the patient that there may be a patient responsible charge related to this service. The patient expressed understanding and agreed to proceed.  Location of patient: Home  Patient gave explicit verbal consent for video visit:  YES  Location of provider:  [facility] office  Persons other than physician and patient involved in provider conference:  None   Subjective:   History of Present Illness:    She is doing a video visit today to discuss her most recent ultrasound and her Roma score.  She has a known ovarian teratoma and cyst on her opposite ovary.  She reports that she occasionally has pelvic pain but it is not disabling.  She still has not decided upon completion of childbearing and so has been hesitant to have definitive surgery.  Her most recent ultrasound and Roma score was simply to confirm that nothing significant has changed.  Hx: The following portions of the patient's history were reviewed and updated as appropriate:             She  has a past medical history of Anxiety, Enlarged thyroid, and Hypertension. She does not have any pertinent problems on file. She  has a past surgical history that includes No past surgeries. Her family history includes Diabetes in her sister; Luiz Blare' disease in her father. She  reports that she has been smoking cigars. She has never used smokeless tobacco. She reports that she does not drink alcohol and does not use drugs. She has a current medication list which includes  the following prescription(s): amlodipine, amoxicillin-clavulanate, benzonatate, cetirizine hcl, chlorthalidone, docusate sodium, ibuprofen, methocarbamol, naproxen, ondansetron, polyvinyl alcohol-povidone, and vitamin d (ergocalciferol). She has no known allergies.       Review of Systems:  Review of Systems  Constitutional: Denied constitutional symptoms, night sweats, recent illness, fatigue, fever, insomnia and weight loss.  Eyes: Denied eye symptoms, eye pain, photophobia, vision change and visual disturbance.  Ears/Nose/Throat/Neck: Denied ear, nose, throat or neck symptoms, hearing loss, nasal discharge, sinus congestion and sore throat.  Cardiovascular: Denied cardiovascular symptoms, arrhythmia, chest pain/pressure, edema, exercise intolerance, orthopnea and palpitations.  Respiratory: Denied pulmonary symptoms, asthma, pleuritic pain, productive sputum, cough, dyspnea and wheezing.  Gastrointestinal: Denied, gastro-esophageal reflux, melena, nausea and vomiting.  Genitourinary: Denied genitourinary symptoms including symptomatic vaginal discharge, pelvic relaxation issues, and urinary complaints.  Musculoskeletal: Denied musculoskeletal symptoms, stiffness, swelling, muscle weakness and myalgia.  Dermatologic: Denied dermatology symptoms, rash and scar.  Neurologic: Denied neurology symptoms, dizziness, headache, neck pain and syncope.  Psychiatric: Denied psychiatric symptoms, anxiety and depression.  Endocrine: Denied endocrine symptoms including hot flashes and night sweats.   Meds:   Current Outpatient Medications on File Prior to Visit  Medication Sig Dispense Refill   amLODipine (NORVASC) 5 MG tablet Take 5 mg by mouth daily.     amoxicillin-clavulanate (AUGMENTIN) 875-125 MG tablet Take 1 tablet by mouth every 12 (twelve) hours. (Patient not taking: Reported on 08/27/2023) 14 tablet 0   benzonatate (TESSALON PERLES) 100 MG capsule Take 1 capsule (  100 mg total) by mouth 3  (three) times daily as needed. (Patient not taking: Reported on 08/27/2023) 20 capsule 0   Cetirizine HCl (ZYRTEC PO) Take by mouth.     chlorthalidone (HYGROTON) 25 MG tablet Take 1 tablet by mouth daily.     docusate sodium (COLACE) 100 MG capsule Take 1 capsule (100 mg total) by mouth 2 (two) times daily as needed. (Patient not taking: Reported on 08/27/2023) 30 capsule 2   ibuprofen (ADVIL,MOTRIN) 800 MG tablet Take 1 tablet (800 mg total) by mouth every 8 (eight) hours as needed. 60 tablet 1   methocarbamol (ROBAXIN) 500 MG tablet Take 1 tablet (500 mg total) by mouth 4 (four) times daily. (Patient not taking: Reported on 08/27/2023) 30 tablet 0   naproxen (NAPROSYN) 500 MG tablet Take 1 tablet (500 mg total) by mouth 2 (two) times daily with a meal. (Patient not taking: Reported on 08/27/2023) 30 tablet 0   ondansetron (ZOFRAN ODT) 8 MG disintegrating tablet Take 1 tablet (8 mg total) by mouth 2 (two) times daily. (Patient not taking: Reported on 08/27/2023) 6 tablet 0   Polyvinyl Alcohol-Povidone (CLEAR EYES ALL SEASONS) 5-6 MG/ML SOLN Apply to eye.     Vitamin D, Ergocalciferol, (DRISDOL) 1.25 MG (50000 UNIT) CAPS capsule Take 50,000 Units by mouth once a week.     No current facility-administered medications on file prior to visit.    Assessment:    [id number] Patient Active Problem List   Diagnosis Date Noted   Encounter for induction of labor 11/21/2017   Cramping affecting pregnancy, antepartum 11/20/2017   Poor weight gain of pregnancy, third trimester 08/21/2017   Goiter 05/03/2017   Obesity (BMI 30-39.9) 05/03/2017   Threatened abortion 03/21/2017   Adnexal mass 03/21/2017     1. Ovarian teratoma, left   2. Complex ovarian cyst     Little change in the cysts or in the teratoma  Roma score negative  She remains relatively asymptomatic  Plan:            1.  We have discussed risks and benefits of surgery versus expectant management.  At this time she would like to continue  with expectant management because she is not sure about future childbearing. She is not being pushed to have surgery because she is not having significant pelvic pain and the teratoma and cysts have not changed much in size.  We are reassured by a negative Roma as well. Once she has made a final decision she will inform us and we can proceed in that direction but at this point we will continue with expectant management. Orders No orders of the defined types were placed in this encounter.   No orders of the defined types were placed in this encounter.     F/U  Return for Annual Physical.   Elonda Husky, M.D. 09/23/2023 9:06 AM

## 2024-06-29 ENCOUNTER — Other Ambulatory Visit: Payer: Self-pay

## 2024-06-29 DIAGNOSIS — N83299 Other ovarian cyst, unspecified side: Secondary | ICD-10-CM

## 2024-06-29 DIAGNOSIS — D271 Benign neoplasm of left ovary: Secondary | ICD-10-CM

## 2024-06-29 NOTE — Progress Notes (Signed)
 Pt needs oophorectomy consult.  No provider in our office offer that surgery at the moment.  Ambulatory gyn referral submitted.

## 2024-07-13 ENCOUNTER — Ambulatory Visit: Admitting: Obstetrics & Gynecology
